# Patient Record
Sex: Male | Born: 1985
Health system: Southern US, Community
[De-identification: ages and names within clinical notes are randomized; demographics above are authoritative.]

## PROBLEM LIST (undated history)

## (undated) DIAGNOSIS — F419 Anxiety disorder, unspecified: Secondary | ICD-10-CM

## (undated) DIAGNOSIS — J45909 Unspecified asthma, uncomplicated: Secondary | ICD-10-CM

## (undated) DIAGNOSIS — T7840XA Allergy, unspecified, initial encounter: Secondary | ICD-10-CM

## (undated) DIAGNOSIS — K219 Gastro-esophageal reflux disease without esophagitis: Secondary | ICD-10-CM

## (undated) HISTORY — DX: Unspecified asthma, uncomplicated: J45.909

## (undated) HISTORY — PX: TYMPANOSTOMY TUBE PLACEMENT: SHX32

## (undated) HISTORY — DX: Gastro-esophageal reflux disease without esophagitis: K21.9

## (undated) HISTORY — DX: Allergy, unspecified, initial encounter: T78.40XA

## (undated) HISTORY — PX: APPENDECTOMY: SHX54

## (undated) HISTORY — DX: Anxiety disorder, unspecified: F41.9

---
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
## 2007-03-01 HISTORY — PX: FRACTURE SURGERY: SHX138

## 2013-10-20 ENCOUNTER — Ambulatory Visit (INDEPENDENT_AMBULATORY_CARE_PROVIDER_SITE_OTHER): Payer: BC Managed Care – PPO | Admitting: Family Medicine

## 2013-10-20 ENCOUNTER — Encounter: Payer: Self-pay | Admitting: Family Medicine

## 2013-10-20 VITALS — BP 119/71 | HR 70 | Temp 97.3°F | Ht 67.0 in | Wt 160.8 lb

## 2013-10-20 DIAGNOSIS — F32A Depression, unspecified: Secondary | ICD-10-CM

## 2013-10-20 DIAGNOSIS — R61 Generalized hyperhidrosis: Secondary | ICD-10-CM

## 2013-10-20 DIAGNOSIS — Z0000 Encounter for general adult medical examination without abnormal findings: Secondary | ICD-10-CM

## 2013-10-20 DIAGNOSIS — F3289 Other specified depressive episodes: Secondary | ICD-10-CM

## 2013-10-20 DIAGNOSIS — J45909 Unspecified asthma, uncomplicated: Secondary | ICD-10-CM

## 2013-10-20 DIAGNOSIS — L74519 Primary focal hyperhidrosis, unspecified: Secondary | ICD-10-CM

## 2013-10-20 DIAGNOSIS — K649 Unspecified hemorrhoids: Secondary | ICD-10-CM

## 2013-10-20 DIAGNOSIS — F329 Major depressive disorder, single episode, unspecified: Secondary | ICD-10-CM

## 2013-10-20 LAB — POCT CBC
Granulocyte percent: 67.9 %G (ref 37–80)
HCT, POC: 50.2 % (ref 43.5–53.7)
Hemoglobin: 16.3 g/dL (ref 14.1–18.1)
Lymph, poc: 2.1 (ref 0.6–3.4)
MCH, POC: 29.7 pg (ref 27–31.2)
MCHC: 32.5 g/dL (ref 31.8–35.4)
MCV: 91.5 fL (ref 80–97)
MPV: 8.4 fL (ref 0–99.8)
POC Granulocyte: 4.8 (ref 2–6.9)
POC LYMPH PERCENT: 29.3 %L (ref 10–50)
Platelet Count, POC: 235 10*3/uL (ref 142–424)
RBC: 5.5 M/uL (ref 4.69–6.13)
RDW, POC: 12.3 %
WBC: 7.1 10*3/uL (ref 4.6–10.2)

## 2013-10-20 MED ORDER — HYDROCORTISONE ACETATE 25 MG RE SUPP: 25.0000 mg | Freq: Two times a day (BID) | RECTAL | Status: DC

## 2013-10-20 MED ORDER — SERTRALINE HCL 50 MG PO TABS: 50.0000 mg | ORAL_TABLET | Freq: Every day | ORAL | Status: DC

## 2013-10-20 MED ORDER — FLUTICASONE-SALMETEROL 250-50 MCG/DOSE IN AEPB: 1.0000 | INHALATION_SPRAY | Freq: Two times a day (BID) | RESPIRATORY_TRACT | Status: DC

## 2013-10-20 MED ORDER — ALBUTEROL SULFATE HFA 108 (90 BASE) MCG/ACT IN AERS: 1.0000 | INHALATION_SPRAY | Freq: Four times a day (QID) | RESPIRATORY_TRACT | Status: DC | PRN

## 2013-10-20 NOTE — Patient Instructions (Signed)

## 2013-10-20 NOTE — Progress Notes (Signed)
Subjective:    Patient ID: Roy Scott, male    DOB: 09/23/1986, 27 y.o.   MRN: [id number]  HPI This 27 y.o. male presents for evaluation of lactose intolerance and anxiety. He has been having abdominal pain discomfort, IBS sx's, and GERD sx's. He has been having back pain.  He has depression problems.  He has  Been having problems with hyperhydrosis of the palms.  He has been Having difficulty with GI problems and depression though mainly.  He Has not had CPE in awhile   Review of Systems C/o GERD and anxiety. No chest pain, SOB, HA, dizziness, vision change, N/V, diarrhea, constipation, dysuria, urinary urgency or frequency, myalgias, arthralgias or rash.     Objective:   Physical Exam Vital signs noted  Well developed well nourished male.  HEENT - Head atraumatic Normocephalic                Eyes - PERRLA, Conjuctiva - clear Sclera- Clear EOMI                Ears - EAC's Wnl TM's Wnl Gross Hearing WNL                Nose - Nares patent                 Throat - oropharanx wnl Respiratory - Lungs CTA bilateral Cardiac - RRR S1 and S2 without murmur GI - Abdomen soft Nontender and bowel sounds active x 4 GU - no testicular masses no inguinal hernia, penis normal Rectal - No masses and prostates smooth and normal. Extremities - No edema. Neuro - Grossly intact.       Assessment & Plan:  Hyperhydrosis disorder - Plan: Ambulatory referral to Dermatology  Depression - Plan: sertraline (ZOLOFT) 50 MG tablet po qd.  Follow up in 3 weeks.  Routine general medical examination at a health care facility - Plan: POCT CBC, CMP14+EGFR, Thyroid Panel With TSH, Vit D  25 hydroxy (rtn osteoporosis monitoring), Vitamin B12, Lipid panel  Asthma - Plan: Fluticasone-Salmeterol (ADVAIR DISKUS) 250-50 MCG/DOSE AEPB, albuterol (PROAIR HFA) 108 (90 BASE) MCG/ACT inhaler  Hemorrhoids - Plan: hydrocortisone (ANUSOL-HC) 25 MG suppository  GERD - Nexium 40mg one po qd #10 samples given.   Recommned align probiotics otc.  Discussed follow up in 3 weeks.  William J Oxford FNP  

## 2013-10-21 LAB — LIPID PANEL
Chol/HDL Ratio: 6.3 ratio units — ABNORMAL HIGH (ref 0.0–5.0)
Cholesterol, Total: 152 mg/dL (ref 100–199)
HDL: 24 mg/dL — ABNORMAL LOW (ref >39)
LDL Calculated: 86 mg/dL (ref 0–99)
Triglycerides: 208 mg/dL — ABNORMAL HIGH (ref 0–149)
VLDL Cholesterol Cal: 42 mg/dL — ABNORMAL HIGH (ref 5–40)

## 2013-10-21 LAB — CMP14+EGFR
ALT: 12 IU/L (ref 0–44)
AST: 14 IU/L (ref 0–40)
Albumin/Globulin Ratio: 2.1 (ref 1.1–2.5)
Albumin: 4.9 g/dL (ref 3.5–5.5)
Alkaline Phosphatase: 53 IU/L (ref 39–117)
BUN/Creatinine Ratio: 10 (ref 8–19)
BUN: 11 mg/dL (ref 6–20)
CO2: 22 mmol/L (ref 18–29)
Calcium: 9.2 mg/dL (ref 8.7–10.2)
Chloride: 101 mmol/L (ref 97–108)
Creatinine, Ser: 1.05 mg/dL (ref 0.76–1.27)
GFR calc Af Amer: 112 mL/min/{1.73_m2} (ref >59)
GFR calc non Af Amer: 97 mL/min/{1.73_m2} (ref >59)
Globulin, Total: 2.3 g/dL (ref 1.5–4.5)
Glucose: 91 mg/dL (ref 65–99)
Potassium: 3.6 mmol/L (ref 3.5–5.2)
Sodium: 142 mmol/L (ref 134–144)
Total Bilirubin: 0.7 mg/dL (ref 0.0–1.2)
Total Protein: 7.2 g/dL (ref 6.0–8.5)

## 2013-10-21 LAB — THYROID PANEL WITH TSH
Free Thyroxine Index: 2.3 (ref 1.2–4.9)
T3 Uptake Ratio: 25 % (ref 24–39)
T4, Total: 9 ug/dL (ref 4.5–12.0)
TSH: 1.13 u[IU]/mL (ref 0.450–4.500)

## 2013-10-21 LAB — VITAMIN D 25 HYDROXY (VIT D DEFICIENCY, FRACTURES): Vit D, 25-Hydroxy: 31.5 ng/mL (ref 30.0–100.0)

## 2013-10-21 LAB — VITAMIN B12: Vitamin B-12: 533 pg/mL (ref 211–946)

## 2013-11-10 ENCOUNTER — Ambulatory Visit: Payer: BC Managed Care – PPO | Admitting: Family Medicine

## 2013-11-17 ENCOUNTER — Ambulatory Visit (INDEPENDENT_AMBULATORY_CARE_PROVIDER_SITE_OTHER): Payer: BC Managed Care – PPO | Admitting: Family Medicine

## 2013-11-17 ENCOUNTER — Encounter: Payer: Self-pay | Admitting: Family Medicine

## 2013-11-17 VITALS — BP 130/74 | HR 67 | Temp 98.2°F | Ht 67.0 in | Wt 167.0 lb

## 2013-11-17 DIAGNOSIS — R61 Generalized hyperhidrosis: Secondary | ICD-10-CM

## 2013-11-17 DIAGNOSIS — L74519 Primary focal hyperhidrosis, unspecified: Secondary | ICD-10-CM

## 2013-11-17 MED ORDER — ALUMINUM CHLORIDE 20 % EX SOLN: Freq: Every day | CUTANEOUS | Status: DC

## 2013-11-17 NOTE — Progress Notes (Signed)
   Subjective:    Patient ID: Roy GalleryLance C Dietzman, male    DOB: 02-Apr-1986, 28 y.o.   MRN: [id number]  HPI  This 28 y.o. male presents for evaluation of follow up on hyperhydrosis.  He was referred to  [place]atology in [place] and he states he was not satisfied.  He is still having difficulties with this.  Review of Systems C/o sweating in hands   No chest pain, SOB, HA, dizziness, vision change, N/V, diarrhea, constipation, dysuria, urinary urgency or frequency, myalgias, arthralgias or rash.  Objective:   Physical Exam  Vital signs noted  Well developed well nourished male.  HEENT - Head atraumatic Normocephalic                Eyes - PERRLA, Conjuctiva - clear Sclera- Clear EOMI                Ears - EAC's Wnl TM's Wnl Gross Hearing WNL                Throat - oropharanx wnl Respiratory - Lungs CTA bilateral Cardiac - RRR S1 and S2 without murmur GI - Abdomen soft Nontender and bowel sounds active x 4 Extremities - No edema. Skin - hyperhydrosis of the hands Neuro - Grossly intact.      Assessment & Plan:  Hyperhydrosis disorder - Plan: aluminum chloride (HYPERCARE) 20 % external solution If not working then consider referral to [facility] in [place].  Deatra CanterWilliam J Oxford FNP

## 2015-10-13 ENCOUNTER — Encounter: Payer: Self-pay | Admitting: Family Medicine

## 2015-10-13 ENCOUNTER — Ambulatory Visit (INDEPENDENT_AMBULATORY_CARE_PROVIDER_SITE_OTHER): Payer: BLUE CROSS/BLUE SHIELD | Admitting: Family Medicine

## 2015-10-13 ENCOUNTER — Ambulatory Visit: Payer: Self-pay | Admitting: Family

## 2015-10-13 VITALS — BP 134/83 | HR 78 | Temp 97.3°F | Ht 67.0 in | Wt 177.4 lb

## 2015-10-13 DIAGNOSIS — J45909 Unspecified asthma, uncomplicated: Secondary | ICD-10-CM

## 2015-10-13 MED ORDER — ALBUTEROL SULFATE HFA 108 (90 BASE) MCG/ACT IN AERS: 1.0000 | INHALATION_SPRAY | Freq: Four times a day (QID) | RESPIRATORY_TRACT | Status: DC | PRN

## 2015-10-13 MED ORDER — PREDNISONE 20 MG PO TABS: ORAL_TABLET | ORAL | Status: DC

## 2015-10-13 MED ORDER — FLUTICASONE-SALMETEROL 250-50 MCG/DOSE IN AEPB: 1.0000 | INHALATION_SPRAY | Freq: Two times a day (BID) | RESPIRATORY_TRACT | Status: DC

## 2015-10-13 NOTE — Progress Notes (Signed)
BP 134/83 mmHg  Pulse 78  Temp(Src) 97.3 F (36.3 C) (Oral)  Ht  (1.702 m)  Wt 177 lb 6.4 oz (80.468 kg)  BMI 27.78 kg/m2   Subjective:    Patient ID: Roy Scott, male    DOB: [birth year], 30 y.o.   MRN: [id number]  HPI: Roy Scott is a 30 y.o. male presenting on 10/13/2015 for Sinusitis; Cough; and skin tag   HPI Sinus congestion and cough and a small amount of wheeze Patient has been having sinus congestion and coughing and a small amount of wheezing that has been persistent over the past 3 weeks. He denies any fevers or chills. The cough is been mostly dry and is keeping him up at night. He has been diagnosed with asthma in the past and feels like this could be starting to bring it on. He has been out of all of his inhaler so he has not had anything that he could use for them. The sore throat is what's bothering the most and it is worse at night along with the coughing. Usually has to use an inhaler in springtime and fall time mostly. In the spring and fall he has to use it every other day. In the winter and summer he does a lot better where he only has to use it a few times a month. Except for when he gets sick like he is currently usually doesn't have a lot of nighttime symptoms.  Relevant past medical, surgical, family and social history reviewed and updated as indicated. Interim medical history since our last visit reviewed. Allergies and medications reviewed and updated.  Review of Systems  Constitutional: Negative for fever and chills.  HENT: Positive for congestion, postnasal drip, rhinorrhea, sinus pressure, sneezing and sore throat. Negative for ear discharge, ear pain and voice change.   Eyes: Negative for pain, discharge, redness and visual disturbance.  Respiratory: Positive for cough and wheezing. Negative for shortness of breath.   Cardiovascular: Negative for chest pain and leg swelling.  Gastrointestinal: Negative for abdominal pain, diarrhea and  constipation.  Genitourinary: Negative for difficulty urinating.  Musculoskeletal: Negative for back pain and gait problem.  Skin: Negative for rash.  Neurological: Negative for syncope, light-headedness and headaches.  All other systems reviewed and are negative.   Per HPI unless specifically indicated above     Medication List       This list is accurate as of: 10/13/15  2:41 PM.  Always use your most recent med list.               albuterol 108 (90 Base) MCG/ACT inhaler  Commonly known as:  PROAIR HFA  Inhale 1-2 puffs into the lungs every 6 (six) hours as needed for wheezing or shortness of breath.     CHLORASEPTIC 1.4 % Liqd  Generic drug:  phenol  Use as directed 1 spray in the mouth or throat as needed for throat irritation / pain.     Fluticasone-Salmeterol 250-50 MCG/DOSE Aepb  Commonly known as:  ADVAIR DISKUS  Inhale 1 puff into the lungs 2 (two) times daily at 10 AM and 5 PM.     ibuprofen 200 MG tablet  Commonly known as:  ADVIL,MOTRIN  Take 400 mg by mouth every 6 (six) hours as needed.     predniSONE 20 MG tablet  Commonly known as:  DELTASONE  2 po at same time daily for 5 days  Objective:    BP 134/83 mmHg  Pulse 78  Temp(Src) 97.3 F (36.3 C) (Oral)  Ht  (1.702 m)  Wt 177 lb 6.4 oz (80.468 kg)  BMI 27.78 kg/m2  Wt Readings from Last 3 Encounters:  10/13/15 177 lb 6.4 oz (80.468 kg)  11/17/13 167 lb (75.751 kg)  10/20/13 160 lb 12.8 oz (72.938 kg)    Physical Exam  Constitutional: He is oriented to person, place, and time. He appears well-developed and well-nourished. No distress.  HENT:  Right Ear: Tympanic membrane, external ear and ear canal normal.  Left Ear: Tympanic membrane, external ear and ear canal normal.  Nose: Mucosal edema and rhinorrhea present. No sinus tenderness. No epistaxis. Right sinus exhibits no maxillary sinus tenderness and no frontal sinus tenderness. Left sinus exhibits no maxillary sinus  tenderness and no frontal sinus tenderness.  Mouth/Throat: Uvula is midline and mucous membranes are normal. Posterior oropharyngeal edema and posterior oropharyngeal erythema present. No oropharyngeal exudate or tonsillar abscesses.  Eyes: Conjunctivae and EOM are normal. Pupils are equal, round, and reactive to light. Right eye exhibits no discharge. No scleral icterus.  Neck: Neck supple. No thyromegaly present.  Cardiovascular: Normal rate, regular rhythm, normal heart sounds and intact distal pulses.   No murmur heard. Pulmonary/Chest: Effort normal and breath sounds normal. No respiratory distress. He has no wheezes. He has no rales.  Musculoskeletal: Normal range of motion. He exhibits no edema.  Lymphadenopathy:    He has no cervical adenopathy.  Neurological: He is alert and oriented to person, place, and time. Coordination normal.  Skin: Skin is warm and dry. No rash noted. He is not diaphoretic.  Psychiatric: He has a normal mood and affect. His behavior is normal.  Nursing note and vitals reviewed.       Assessment & Plan:   Problem List Items Addressed This Visit    None    Visit Diagnoses    Asthma, unspecified asthma severity, uncomplicated    -  Primary    mild exacerbation, steroids and antihistamine and antibiotic    Relevant Medications    Fluticasone-Salmeterol (ADVAIR DISKUS) 250-50 MCG/DOSE AEPB    albuterol (PROAIR HFA) 108 (90 Base) MCG/ACT inhaler    predniSONE (DELTASONE) 20 MG tablet        Follow up plan: Return in about 6 months (around 04/11/2016), or if symptoms worsen or fail to improve, for Asthma recheck.  Counseling provided for all of the vaccine components No orders of the defined types were placed in this encounter.    Arville Care, MD [facility] 10/13/2015, 2:41 PM

## 2015-10-15 ENCOUNTER — Ambulatory Visit: Payer: BLUE CROSS/BLUE SHIELD | Admitting: Family Medicine

## 2015-10-18 ENCOUNTER — Encounter: Payer: Self-pay | Admitting: Family Medicine

## 2015-11-30 ENCOUNTER — Encounter: Payer: Self-pay | Admitting: Family Medicine

## 2015-11-30 ENCOUNTER — Encounter (INDEPENDENT_AMBULATORY_CARE_PROVIDER_SITE_OTHER): Payer: Self-pay

## 2015-11-30 ENCOUNTER — Ambulatory Visit (INDEPENDENT_AMBULATORY_CARE_PROVIDER_SITE_OTHER): Payer: BLUE CROSS/BLUE SHIELD | Admitting: Family Medicine

## 2015-11-30 VITALS — BP 125/87 | HR 65 | Temp 97.5°F | Ht 67.0 in | Wt 176.8 lb

## 2015-11-30 DIAGNOSIS — J029 Acute pharyngitis, unspecified: Secondary | ICD-10-CM

## 2015-11-30 LAB — CULTURE, GROUP A STREP

## 2015-11-30 LAB — RAPID STREP SCREEN (MED CTR MEBANE ONLY): STREP GP A AG, IA W/REFLEX: NEGATIVE

## 2015-11-30 MED ORDER — FLUCONAZOLE 150 MG PO TABS: 150.0000 mg | ORAL_TABLET | Freq: Once | ORAL | Status: DC

## 2015-11-30 NOTE — Progress Notes (Signed)
BP 125/87 mmHg  Pulse 65  Temp(Src) 97.5 F (36.4 C) (Oral)  Ht 5\' 7"  (1.702 m)  Wt 176 lb 12.8 oz (80.196 kg)  BMI 27.68 kg/m2   Subjective:    Patient ID: Roy Scott, male    DOB: [birth year], 30 y.o.   MRN: [id number]  HPI: Roy Scott is a 30 y.o. male presenting on 11/30/2015 for No chief complaint on file.   HPI Sore throat Patient has had persistent sore throat for the past 2 months. He never really got better from last time he saw me. I did improve slightly with the treatment that we started but then it is persisted. Over the past week it is certainly worsen and he feels like his throat is certainly get tight and close up on him. He does have asthma and is on current treatment for asthma with both Advair and pro-air. He does admit that he does not swish and swallow after the medication. He does also admit to having some acid reflux and heartburn. He denies any blood in his stools or vomiting with blood. He denies any abdominal pain outside of the indigestion. He is just concerned because it is not improving.  Relevant past medical, surgical, family and social history reviewed and updated as indicated. Interim medical history since our last visit reviewed. Allergies and medications reviewed and updated.  Review of Systems  Constitutional: Negative for fever and chills.  HENT: Positive for sore throat. Negative for congestion, ear discharge, ear pain, mouth sores, sinus pressure and sneezing.   Eyes: Negative for discharge and visual disturbance.  Respiratory: Negative for cough, chest tightness, shortness of breath and wheezing.   Cardiovascular: Negative for chest pain and leg swelling.  Gastrointestinal: Negative for abdominal pain, diarrhea and constipation.  Genitourinary: Negative for difficulty urinating.  Musculoskeletal: Negative for back pain and gait problem.  Skin: Negative for rash.  Neurological: Negative for syncope, light-headedness and headaches.  All  other systems reviewed and are negative.   Per HPI unless specifically indicated above     Medication List       This list is accurate as of: 11/30/15  2:34 PM.  Always use your most recent med list.               albuterol 108 (90 Base) MCG/ACT inhaler  Commonly known as:  PROAIR HFA  Inhale 1-2 puffs into the lungs every 6 (six) hours as needed for wheezing or shortness of breath.     fluconazole 150 MG tablet  Commonly known as:  DIFLUCAN  Take 1 tablet (150 mg total) by mouth once.     Fluticasone-Salmeterol 250-50 MCG/DOSE Aepb  Commonly known as:  ADVAIR DISKUS  Inhale 1 puff into the lungs 2 (two) times daily at 10 AM and 5 PM.     ibuprofen 200 MG tablet  Commonly known as:  ADVIL,MOTRIN  Take 400 mg by mouth every 6 (six) hours as needed.           Objective:    BP 125/87 mmHg  Pulse 65  Temp(Src) 97.5 F (36.4 C) (Oral)  Ht 5\' 7"  (1.702 m)  Wt 176 lb 12.8 oz (80.196 kg)  BMI 27.68 kg/m2  Wt Readings from Last 3 Encounters:  11/30/15 176 lb 12.8 oz (80.196 kg)  10/13/15 177 lb 6.4 oz (80.468 kg)  11/17/13 167 lb (75.751 kg)    Physical Exam  Constitutional: He is oriented to person, place, and time. He appears  well-developed and well-nourished. No distress.  HENT:  Head: Normocephalic.  Right Ear: External ear normal.  Left Ear: External ear normal.  Nose: Nose normal.  Mouth/Throat: Oropharynx is clear and moist. No oropharyngeal exudate (no visible signs of abnormalities in his oropharynx. No visible signs of thrush but just suspicious for with the inhalers and the symptoms.).  Eyes: Conjunctivae and EOM are normal. Pupils are equal, round, and reactive to light. Right eye exhibits no discharge. No scleral icterus.  Neck: Neck supple. No thyromegaly present.  Cardiovascular: Normal rate, regular rhythm, normal heart sounds and intact distal pulses.   No murmur heard. Pulmonary/Chest: Effort normal and breath sounds normal. No respiratory  distress. He has no wheezes.  Musculoskeletal: Normal range of motion. He exhibits no edema.  Lymphadenopathy:    He has no cervical adenopathy.  Neurological: He is alert and oriented to person, place, and time. Coordination normal.  Skin: Skin is warm and dry. No rash noted. He is not diaphoretic.  Psychiatric: He has a normal mood and affect. His behavior is normal.  Vitals reviewed.      Assessment & Plan:   Problem List Items Addressed This Visit    None    Visit Diagnoses    Sore throat    -  Primary    Persistent pharyngitis for 2 months, will try dose of Diflucan because he is on inhaled steroid, if not improved ENT    Relevant Medications    fluconazole (DIFLUCAN) 150 MG tablet    Other Relevant Orders    Rapid strep screen (not at [facility])    Ambulatory referral to ENT        Follow up plan: Return in about 3 months (around 03/01/2016), or if symptoms worsen or fail to improve, for Recheck asthma.  Counseling provided for all of the vaccine components Orders Placed This Encounter  Procedures  . Rapid strep screen (not at [facility])  . Ambulatory referral to ENT    Arville Care, MD [facility] 11/30/2015, 2:34 PM

## 2015-12-02 ENCOUNTER — Telehealth: Payer: Self-pay | Admitting: Family Medicine

## 2015-12-02 NOTE — Telephone Encounter (Signed)
No answer no vm

## 2015-12-02 NOTE — Telephone Encounter (Signed)
Informed pt that Rx for Diflucan went to [facility], he will check back with them

## 2016-06-15 ENCOUNTER — Encounter: Payer: Self-pay | Admitting: Family Medicine

## 2016-06-15 ENCOUNTER — Ambulatory Visit (INDEPENDENT_AMBULATORY_CARE_PROVIDER_SITE_OTHER): Payer: Self-pay | Admitting: Family Medicine

## 2016-06-15 VITALS — BP 116/80 | HR 69 | Temp 97.9°F | Ht 67.0 in | Wt 169.4 lb

## 2016-06-15 DIAGNOSIS — M5442 Lumbago with sciatica, left side: Secondary | ICD-10-CM

## 2016-06-15 DIAGNOSIS — F411 Generalized anxiety disorder: Secondary | ICD-10-CM

## 2016-06-15 MED ORDER — CYCLOBENZAPRINE HCL 10 MG PO TABS: 10.0000 mg | ORAL_TABLET | Freq: Three times a day (TID) | ORAL | 1 refills | Status: DC | PRN

## 2016-06-15 MED ORDER — HYDROXYZINE HCL 50 MG PO TABS: 50.0000 mg | ORAL_TABLET | Freq: Three times a day (TID) | ORAL | 2 refills | Status: DC | PRN

## 2016-06-15 NOTE — Progress Notes (Signed)
BP 116/80   Pulse 69   Temp 97.9 F (36.6 C) (Oral)   Ht 5\' 7"  (1.702 m)   Wt 169 lb 6 oz (76.8 kg)   BMI 26.53 kg/m    Subjective:    Patient ID: Roy Scott, male    DOB: [birth year], 30 y.o.   MRN: [id number]  HPI: Roy Scott is a 30 y.o. male presenting on 06/15/2016 for Back Pain (knot on lower left side of back, low back pain)   HPI Low back pain with intermittent sciatica Patient is having low back pain with intermittent sciatica going down his left leg. It is left lower back that hurts and he feels like there is a knot there and the spot where it hurts. He has been having this for quite some time but over the last month it has increased to the point where he is coming in to talk about something to help with this. He denies any numbness or weakness going down into his leg. He does sciatica is only an occasional shooting pain that goes down the back of his left leg to his knee but no lower. He denies any loss of bowel or loss of bladder.  Anxiety Patient has been having intermittent anxiety where sometimes it'll build up that he feels that tightness in his throat and that's why the ENT recommended treatment for his anxiety to help control this. He denies any suicidal ideations or thoughts of hurting himself. He does admit that he has trouble sleeping at night sometimes. He doesn't feel like it every day.  Relevant past medical, surgical, family and social history reviewed and updated as indicated. Interim medical history since our last visit reviewed. Allergies and medications reviewed and updated.  Review of Systems  Constitutional: Negative for chills and fever.  Eyes: Negative for discharge.  Respiratory: Negative for shortness of breath and wheezing.   Cardiovascular: Negative for chest pain and leg swelling.  Musculoskeletal: Positive for back pain and myalgias. Negative for gait problem.  Skin: Negative for rash.  Psychiatric/Behavioral: Positive for sleep  disturbance. Negative for decreased concentration, dysphoric mood, self-injury and suicidal ideas. The patient is nervous/anxious.   All other systems reviewed and are negative.   Per HPI unless specifically indicated above     Medication List       Accurate as of 06/15/16  1:27 PM. Always use your most recent med list.          albuterol 108 (90 Base) MCG/ACT inhaler Commonly known as:  PROAIR HFA Inhale 1-2 puffs into the lungs every 6 (six) hours as needed for wheezing or shortness of breath.   cyclobenzaprine 10 MG tablet Commonly known as:  FLEXERIL Take 1 tablet (10 mg total) by mouth 3 (three) times daily as needed for muscle spasms.   Fluticasone-Salmeterol 250-50 MCG/DOSE Aepb Commonly known as:  ADVAIR DISKUS Inhale 1 puff into the lungs 2 (two) times daily at 10 AM and 5 PM.   hydrOXYzine 50 MG tablet Commonly known as:  ATARAX/VISTARIL Take 1 tablet (50 mg total) by mouth 3 (three) times daily as needed.   ibuprofen 200 MG tablet Commonly known as:  ADVIL,MOTRIN Take 400 mg by mouth every 6 (six) hours as needed.          Objective:    BP 116/80   Pulse 69   Temp 97.9 F (36.6 C) (Oral)   Ht 5\' 7"  (1.702 m)   Wt 169 lb 6 oz (76.8  kg)   BMI 26.53 kg/m   Wt Readings from Last 3 Encounters:  06/15/16 169 lb 6 oz (76.8 kg)  11/30/15 176 lb 12.8 oz (80.2 kg)  10/13/15 177 lb 6.4 oz (80.5 kg)    Physical Exam  Constitutional: He is oriented to person, place, and time. He appears well-developed and well-nourished. No distress.  Eyes: Conjunctivae are normal. Right eye exhibits no discharge. No scleral icterus.  Cardiovascular: Normal rate, regular rhythm, normal heart sounds and intact distal pulses.   No murmur heard. Pulmonary/Chest: Effort normal and breath sounds normal. No respiratory distress. He has no wheezes.  Musculoskeletal: Normal range of motion. He exhibits no edema.       Lumbar back: He exhibits tenderness (Left lower muscular  tenderness, negative straight leg raise bilaterally). He exhibits normal range of motion, no bony tenderness, no swelling and normal pulse.  Neurological: He is alert and oriented to person, place, and time. Coordination normal.  Skin: Skin is warm and dry. No rash noted. He is not diaphoretic.  Psychiatric: He has a normal mood and affect. His behavior is normal.  Nursing note and vitals reviewed.     Assessment & Plan:   Problem List Items Addressed This Visit    None    Visit Diagnoses    Anxiety state    -  Primary   Relevant Medications   hydrOXYzine (ATARAX/VISTARIL) 50 MG tablet   Left-sided low back pain with left-sided sciatica       Use anti-inflammatories and Flexeril and stretches   Relevant Medications   cyclobenzaprine (FLEXERIL) 10 MG tablet       Follow up plan: Return if symptoms worsen or fail to improve.  Counseling provided for all of the vaccine components No orders of the defined types were placed in this encounter.   Arville Care, MD [facility] 06/15/2016, 1:27 PM

## 2016-10-21 ENCOUNTER — Encounter: Payer: Self-pay | Admitting: Pediatrics

## 2016-10-21 ENCOUNTER — Ambulatory Visit (INDEPENDENT_AMBULATORY_CARE_PROVIDER_SITE_OTHER): Payer: BLUE CROSS/BLUE SHIELD | Admitting: Pediatrics

## 2016-10-21 VITALS — BP 122/79 | HR 80 | Temp 97.1°F | Ht 67.0 in | Wt 175.0 lb

## 2016-10-21 DIAGNOSIS — G8929 Other chronic pain: Secondary | ICD-10-CM

## 2016-10-21 DIAGNOSIS — R350 Frequency of micturition: Secondary | ICD-10-CM

## 2016-10-21 DIAGNOSIS — M5442 Lumbago with sciatica, left side: Secondary | ICD-10-CM

## 2016-10-21 MED ORDER — CYCLOBENZAPRINE HCL 10 MG PO TABS: 10.0000 mg | ORAL_TABLET | Freq: Three times a day (TID) | ORAL | 1 refills | Status: DC | PRN

## 2016-10-21 NOTE — Patient Instructions (Addendum)
Back Exercises Introduction If you have pain in your back, do these exercises 2-3 times each day or as told by your doctor. When the pain goes away, do the exercises once each day, but repeat the steps more times for each exercise (do more repetitions). If you do not have pain in your back, do these exercises once each day or as told by your doctor. Exercises Single Knee to Chest  Do these steps 3-5 times in a row for each leg: 1. Lie on your back on a firm bed or the floor with your legs stretched out. 2. Bring one knee to your chest. 3. Hold your knee to your chest by grabbing your knee or thigh. 4. Pull on your knee until you feel a gentle stretch in your lower back. 5. Keep doing the stretch for 10-30 seconds. 6. Slowly let go of your leg and straighten it. Pelvic Tilt  Do these steps 5-10 times in a row: 1. Lie on your back on a firm bed or the floor with your legs stretched out. 2. Bend your knees so they point up to the ceiling. Your feet should be flat on the floor. 3. Tighten your lower belly (abdomen) muscles to press your lower back against the floor. This will make your tailbone point up to the ceiling instead of pointing down to your feet or the floor. 4. Stay in this position for 5-10 seconds while you gently tighten your muscles and breathe evenly. Cat-Cow  Do these steps until your lower back bends more easily: 1. Get on your hands and knees on a firm surface. Keep your hands under your shoulders, and keep your knees under your hips. You may put padding under your knees. 2. Let your head hang down, and make your tailbone point down to the floor so your lower back is round like the back of a cat. 3. Stay in this position for 5 seconds. 4. Slowly lift your head and make your tailbone point up to the ceiling so your back hangs low (sags) like the back of a cow. 5. Stay in this position for 5 seconds. Press-Ups  Do these steps 5-10 times in a row: 1. Lie on your belly  (face-down) on the floor. 2. Place your hands near your head, about shoulder-width apart. 3. While you keep your back relaxed and keep your hips on the floor, slowly straighten your arms to raise the top half of your body and lift your shoulders. Do not use your back muscles. To make yourself more comfortable, you may change where you place your hands. 4. Stay in this position for 5 seconds. 5. Slowly return to lying flat on the floor. Bridges  Do these steps 10 times in a row: 1. Lie on your back on a firm surface. 2. Bend your knees so they point up to the ceiling. Your feet should be flat on the floor. 3. Tighten your butt muscles and lift your butt off of the floor until your waist is almost as high as your knees. If you do not feel the muscles working in your butt and the back of your thighs, slide your feet 1-2 inches farther away from your butt. 4. Stay in this position for 3-5 seconds. 5. Slowly lower your butt to the floor, and let your butt muscles relax. If this exercise is too easy, try doing it with your arms crossed over your chest. Belly Crunches  Do these steps 5-10 times in a row: 1. Lie   on your back on a firm bed or the floor with your legs stretched out. 2. Bend your knees so they point up to the ceiling. Your feet should be flat on the floor. 3. Cross your arms over your chest. 4. Tip your chin a little bit toward your chest but do not bend your neck. 5. Tighten your belly muscles and slowly raise your chest just enough to lift your shoulder blades a tiny bit off of the floor. 6. Slowly lower your chest and your head to the floor. Back Lifts  Do these steps 5-10 times in a row: 1. Lie on your belly (face-down) with your arms at your sides, and rest your forehead on the floor. 2. Tighten the muscles in your legs and your butt. 3. Slowly lift your chest off of the floor while you keep your hips on the floor. Keep the back of your head in line with the curve in your back.  Look at the floor while you do this. 4. Stay in this position for 3-5 seconds. 5. Slowly lower your chest and your face to the floor. Contact a doctor if:  Your back pain gets a lot worse when you do an exercise.  Your back pain does not lessen 2 hours after you exercise. If you have any of these problems, stop doing the exercises. Do not do them again unless your doctor says it is okay. Get help right away if:  You have sudden, very bad back pain. If this happens, stop doing the exercises. Do not do them again unless your doctor says it is okay. This information is not intended to replace advice given to you by your health care provider. Make sure you discuss any questions you have with your health care provider. Document Released: 10/14/2010 Document Revised: 02/17/2016 Document Reviewed: 11/05/2014  2017 [employer]vier  Shoulder Range of Motion Exercises Shoulder range of motion (ROM) exercises are designed to keep the shoulder moving freely. They are often recommended for people who have shoulder pain. Phase 1 exercises When you are able, do this exercise 5-6 days per week, or as told by your health care provider. Work toward doing 2 sets of 10 swings. Pendulum Exercise  How To Do This Exercise Lying Down 7. Lie face-down on a bed with your abdomen close to the side of the bed. 8. Let your arm hang over the side of the bed. 9. Relax your shoulder, arm, and hand. 10. Slowly and gently swing your arm forward and back. Do not use your neck muscles to swing your arm. They should be relaxed. If you are struggling to swing your arm, have someone gently swing it for you. When you do this exercise for the first time, swing your arm at a 15 degree angle for 15 seconds, or swing your arm 10 times. As pain lessens over time, increase the angle of the swing to 30-45 degrees. 11. Repeat steps 1-4 with the other arm. How To Do This Exercise While Standing 5. Stand next to a sturdy chair or table and  hold on to it with your hand. 1. Bend forward at the waist. 2. Bend your knees slightly. 3. Relax your other arm and let it hang limp. 4. Relax the shoulder blade of the arm that is hanging and let it drop. 5. While keeping your shoulder relaxed, use body motion to swing your arm in small circles. The first time you do this exercise, swing your arm for about 30 seconds or 10  times. When you do it next time, swing your arm for a little longer. 6. Stand up tall and relax. 7. Repeat steps 1-7, this time changing the direction of the circles. 6. Repeat steps 1-8 with the other arm. Phase 2 exercises Do these exercises 3-4 times per day on 5-6 days per week or as told by your health care provider. Work toward holding the stretch for 20 seconds. Stretching Exercise 1 6. Lift your arm straight out in front of you. 7. Bend your arm 90 degrees at the elbow (right angle) so your forearm goes across your body and looks like the letter "L." 8. Use your other arm to gently pull the elbow forward and across your body. 9. Repeat steps 1-3 with the other arm. Stretching Exercise 2  You will need a towel or rope for this exercise. 6. Bend one arm behind your back with the palm facing outward. 7. Hold a towel with your other hand. 8. Reach the arm that holds the towel above your head, and bend that arm at the elbow. Your wrist should be behind your neck. 9. Use your free hand to grab the free end of the towel. 10. With the higher hand, gently pull the towel up behind you. 11. With the lower hand, pull the towel down behind you. 12. Repeat steps 1-6 with the other arm. Phase 3 exercises Do each of these exercises at four different times of day (sessions) every day or as told by your health care provider. To begin with, repeat each exercise 5 times (repetitions). Work toward doing 3 sets of 12 repetitions or as told by your health care provider. Strengthening Exercise 1  You will need a light weight for this  activity. As you grow stronger, you may use a heavier weight. 6. Standing with a weight in your hand, lift your arm straight out to the side until it is at the same height as your shoulder. 7. Bend your arm at 90 degrees so that your fingers are pointing to the ceiling. 8. Slowly raise your hand until your arm is straight up in the air. 9. Repeat steps 1-3 with the other arm. Strengthening Exercise 2  You will need a light weight for this activity. As you grow stronger, you may use a heavier weight. 7. Standing with a weight in your hand, gradually move your straight arm in an arc, starting at your side, then out in front of you, then straight up over your head. 8. Gradually move your other arm in an arc, starting at your side, then out in front of you, then straight up over your head. 9. Repeat steps 1-2 with the other arm. Strengthening Exercise 3  You will need an elastic band for this activity. As you grow stronger, gradually increase the size of the bands or increase the number of bands that you use at one time. 6. While standing, hold an elastic band in one hand and raise that arm up in the air. 7. With your other hand, pull down the band until that hand is by your side. 8. Repeat steps 1-2 with the other arm. This information is not intended to replace advice given to you by your health care provider. Make sure you discuss any questions you have with your health care provider. Document Released: 06/10/2003 Document Revised: 05/07/2016 Document Reviewed: 09/07/2014 Elsevier Interactive Patient Education  2017 [employer].
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
## 2016-10-22 LAB — URINE CULTURE: ORGANISM ID, BACTERIA: NO GROWTH

## 2016-10-23 ENCOUNTER — Other Ambulatory Visit: Payer: Self-pay | Admitting: Family Medicine

## 2016-10-23 DIAGNOSIS — J45909 Unspecified asthma, uncomplicated: Secondary | ICD-10-CM

## 2016-10-23 LAB — URINALYSIS
Bilirubin, UA: NEGATIVE
GLUCOSE, UA: NEGATIVE
Leukocytes, UA: NEGATIVE
Nitrite, UA: NEGATIVE
Protein, UA: NEGATIVE
SPEC GRAV UA: 1.02 (ref 1.005–1.030)
UUROB: 0.2 mg/dL (ref 0.2–1.0)
pH, UA: 5.5 (ref 5.0–7.5)

## 2017-04-07 DIAGNOSIS — R209 Unspecified disturbances of skin sensation: Secondary | ICD-10-CM | POA: Diagnosis not present

## 2017-04-12 ENCOUNTER — Encounter: Payer: Self-pay | Admitting: Family Medicine

## 2017-04-12 ENCOUNTER — Ambulatory Visit (INDEPENDENT_AMBULATORY_CARE_PROVIDER_SITE_OTHER): Payer: BLUE CROSS/BLUE SHIELD | Admitting: Family Medicine

## 2017-04-12 VITALS — BP 128/77 | HR 93 | Temp 97.8°F | Ht 67.0 in | Wt 170.0 lb

## 2017-04-12 DIAGNOSIS — K219 Gastro-esophageal reflux disease without esophagitis: Secondary | ICD-10-CM

## 2017-04-12 DIAGNOSIS — S161XXA Strain of muscle, fascia and tendon at neck level, initial encounter: Secondary | ICD-10-CM

## 2017-04-12 DIAGNOSIS — H04123 Dry eye syndrome of bilateral lacrimal glands: Secondary | ICD-10-CM | POA: Diagnosis not present

## 2017-04-12 DIAGNOSIS — H40033 Anatomical narrow angle, bilateral: Secondary | ICD-10-CM | POA: Diagnosis not present

## 2017-04-12 MED ORDER — CYCLOBENZAPRINE HCL 10 MG PO TABS: 10.0000 mg | ORAL_TABLET | Freq: Three times a day (TID) | ORAL | 1 refills | Status: DC | PRN

## 2017-04-12 MED ORDER — FLUTICASONE-SALMETEROL 250-50 MCG/DOSE IN AEPB: 1.0000 | INHALATION_SPRAY | Freq: Two times a day (BID) | RESPIRATORY_TRACT | 3 refills | Status: DC

## 2017-04-12 MED ORDER — ALBUTEROL SULFATE HFA 108 (90 BASE) MCG/ACT IN AERS: 1.0000 | INHALATION_SPRAY | RESPIRATORY_TRACT | 5 refills | Status: DC | PRN

## 2017-04-12 MED ORDER — RANITIDINE HCL 150 MG PO TABS: 150.0000 mg | ORAL_TABLET | Freq: Two times a day (BID) | ORAL | 2 refills | Status: DC
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
## 2017-04-25 DIAGNOSIS — M9901 Segmental and somatic dysfunction of cervical region: Secondary | ICD-10-CM | POA: Diagnosis not present

## 2017-04-25 DIAGNOSIS — M9904 Segmental and somatic dysfunction of sacral region: Secondary | ICD-10-CM | POA: Diagnosis not present

## 2017-04-25 DIAGNOSIS — M9902 Segmental and somatic dysfunction of thoracic region: Secondary | ICD-10-CM | POA: Diagnosis not present

## 2017-04-25 DIAGNOSIS — M9903 Segmental and somatic dysfunction of lumbar region: Secondary | ICD-10-CM | POA: Diagnosis not present

## 2017-04-30 DIAGNOSIS — M9901 Segmental and somatic dysfunction of cervical region: Secondary | ICD-10-CM | POA: Diagnosis not present

## 2017-04-30 DIAGNOSIS — M9903 Segmental and somatic dysfunction of lumbar region: Secondary | ICD-10-CM | POA: Diagnosis not present

## 2017-04-30 DIAGNOSIS — M9902 Segmental and somatic dysfunction of thoracic region: Secondary | ICD-10-CM | POA: Diagnosis not present

## 2017-04-30 DIAGNOSIS — M9904 Segmental and somatic dysfunction of sacral region: Secondary | ICD-10-CM | POA: Diagnosis not present

## 2017-05-03 DIAGNOSIS — M9903 Segmental and somatic dysfunction of lumbar region: Secondary | ICD-10-CM | POA: Diagnosis not present

## 2017-05-03 DIAGNOSIS — M9901 Segmental and somatic dysfunction of cervical region: Secondary | ICD-10-CM | POA: Diagnosis not present

## 2017-05-03 DIAGNOSIS — M9902 Segmental and somatic dysfunction of thoracic region: Secondary | ICD-10-CM | POA: Diagnosis not present

## 2017-05-03 DIAGNOSIS — M9904 Segmental and somatic dysfunction of sacral region: Secondary | ICD-10-CM | POA: Diagnosis not present

## 2017-05-04 ENCOUNTER — Telehealth: Payer: Self-pay | Admitting: Family Medicine

## 2017-05-04 NOTE — Telephone Encounter (Signed)
Patient given appointment for Monday with [facility].

## 2017-05-07 ENCOUNTER — Ambulatory Visit (INDEPENDENT_AMBULATORY_CARE_PROVIDER_SITE_OTHER): Payer: BLUE CROSS/BLUE SHIELD | Admitting: Family Medicine

## 2017-05-07 ENCOUNTER — Encounter: Payer: Self-pay | Admitting: Family Medicine

## 2017-05-07 VITALS — BP 129/83 | HR 84 | Temp 97.0°F | Ht 67.0 in | Wt 174.0 lb

## 2017-05-07 DIAGNOSIS — M545 Low back pain, unspecified: Secondary | ICD-10-CM

## 2017-05-07 DIAGNOSIS — G8929 Other chronic pain: Secondary | ICD-10-CM | POA: Diagnosis not present

## 2017-05-07 DIAGNOSIS — M546 Pain in thoracic spine: Secondary | ICD-10-CM | POA: Diagnosis not present

## 2017-05-07 MED ORDER — PREDNISONE 20 MG PO TABS: ORAL_TABLET | ORAL | 0 refills | Status: DC

## 2017-05-07 NOTE — Progress Notes (Signed)
BP 129/83   Pulse 84   Temp (!) 97 F (36.1 C) (Oral)   Ht 5\' 7"  (1.702 m)   Wt 174 lb (78.9 kg)   BMI 27.25 kg/m    Subjective:    Patient ID: Roy Scott, male    DOB: June 23, 1986, 31 y.o.   MRN: [id number]  HPI: Roy Scott is a 31 y.o. male presenting on 05/07/2017 for Back Pain (lumbar, would like to schedule an MRI)   HPI Low Back Pain Patient complains of low back pain/mid back pain that has been going on for at least a few weeks this time and possibly longer than that he's been dealing with it off and on chronically. Today in the office he says that 3 out of 10 and does not radiate anywhere. When it's at its most severe it's an 8 out of 10. He did go see a chiropractor and has been using a muscle relaxer and ibuprofen very consistently. He says that they helped a little but now he feels like it's actually starting to worsen again. He denies any numbness or weakness or loss of bowel or bladder.  Relevant past medical, surgical, family and social history reviewed and updated as indicated. Interim medical history since our last visit reviewed. Allergies and medications reviewed and updated.  Review of Systems  Constitutional: Negative for chills and fever.  Respiratory: Negative for shortness of breath and wheezing.   Cardiovascular: Negative for chest pain and leg swelling.  Musculoskeletal: Positive for back pain and myalgias. Negative for gait problem.  Skin: Negative for rash.  Neurological: Negative for weakness, light-headedness, numbness and headaches.  All other systems reviewed and are negative.   Per HPI unless specifically indicated above     Objective:    BP 129/83   Pulse 84   Temp (!) 97 F (36.1 C) (Oral)   Ht 5\' 7"  (1.702 m)   Wt 174 lb (78.9 kg)   BMI 27.25 kg/m   Wt Readings from Last 3 Encounters:  05/07/17 174 lb (78.9 kg)  04/12/17 170 lb (77.1 kg)  10/21/16 175 lb (79.4 kg)    Physical Exam  Constitutional: He is oriented to person,  place, and time. He appears well-developed and well-nourished. No distress.  Eyes: Conjunctivae are normal. No scleral icterus.  Cardiovascular: Normal rate, regular rhythm, normal heart sounds and intact distal pulses.   No murmur heard. Pulmonary/Chest: Effort normal and breath sounds normal. No respiratory distress. He has no wheezes. He has no rales.  Abdominal: Soft. Bowel sounds are normal. He exhibits no distension. There is no tenderness. There is no rebound and no guarding.  Musculoskeletal: Normal range of motion. He exhibits no edema.       Lumbar back: He exhibits tenderness (Negative straight leg raise bilaterally). He exhibits normal range of motion.       Back:  Neurological: He is alert and oriented to person, place, and time. Coordination normal.  Skin: Skin is warm and dry. No rash noted. He is not diaphoretic.  Psychiatric: He has a normal mood and affect. His behavior is normal.  Nursing note and vitals reviewed.      Assessment & Plan:   Problem List Items Addressed This Visit    None    Visit Diagnoses    Chronic midline low back pain without sciatica    -  Primary   Relevant Medications   predniSONE (DELTASONE) 20 MG tablet   Other Relevant Orders  MR Lumbar Spine Wo Contrast   Chronic midline thoracic back pain       Relevant Medications   predniSONE (DELTASONE) 20 MG tablet   Other Relevant Orders   MR Lumbar Spine Wo Contrast       Follow up plan: Return if symptoms worsen or fail to improve.  Counseling provided for all of the vaccine components Orders Placed This Encounter  Procedures  . MR Lumbar Spine Wo Contrast    Arville Care, MD [facility] 05/07/2017, 8:43 AM

## 2017-05-09 DIAGNOSIS — M9903 Segmental and somatic dysfunction of lumbar region: Secondary | ICD-10-CM | POA: Diagnosis not present

## 2017-05-09 DIAGNOSIS — M9902 Segmental and somatic dysfunction of thoracic region: Secondary | ICD-10-CM | POA: Diagnosis not present

## 2017-05-09 DIAGNOSIS — M9904 Segmental and somatic dysfunction of sacral region: Secondary | ICD-10-CM | POA: Diagnosis not present

## 2017-05-09 DIAGNOSIS — M9901 Segmental and somatic dysfunction of cervical region: Secondary | ICD-10-CM | POA: Diagnosis not present

## 2017-05-14 DIAGNOSIS — M9901 Segmental and somatic dysfunction of cervical region: Secondary | ICD-10-CM | POA: Diagnosis not present

## 2017-05-14 DIAGNOSIS — M9903 Segmental and somatic dysfunction of lumbar region: Secondary | ICD-10-CM | POA: Diagnosis not present

## 2017-05-14 DIAGNOSIS — M9904 Segmental and somatic dysfunction of sacral region: Secondary | ICD-10-CM | POA: Diagnosis not present

## 2017-05-14 DIAGNOSIS — M9902 Segmental and somatic dysfunction of thoracic region: Secondary | ICD-10-CM | POA: Diagnosis not present

## 2017-05-15 ENCOUNTER — Ambulatory Visit (HOSPITAL_COMMUNITY)
Admission: RE | Admit: 2017-05-15 | Discharge: 2017-05-15 | Disposition: A | Discharge status: Home / Self Care | Payer: BLUE CROSS/BLUE SHIELD | Source: Ambulatory Visit | Attending: Family Medicine | Admitting: Family Medicine

## 2017-05-15 DIAGNOSIS — G8929 Other chronic pain: Secondary | ICD-10-CM | POA: Diagnosis not present

## 2017-05-15 DIAGNOSIS — M545 Low back pain: Secondary | ICD-10-CM | POA: Insufficient documentation

## 2017-05-15 DIAGNOSIS — M546 Pain in thoracic spine: Secondary | ICD-10-CM | POA: Insufficient documentation

## 2017-05-17 DIAGNOSIS — M9901 Segmental and somatic dysfunction of cervical region: Secondary | ICD-10-CM | POA: Diagnosis not present

## 2017-05-17 DIAGNOSIS — M9902 Segmental and somatic dysfunction of thoracic region: Secondary | ICD-10-CM | POA: Diagnosis not present

## 2017-05-17 DIAGNOSIS — M9904 Segmental and somatic dysfunction of sacral region: Secondary | ICD-10-CM | POA: Diagnosis not present

## 2017-05-17 DIAGNOSIS — M9903 Segmental and somatic dysfunction of lumbar region: Secondary | ICD-10-CM | POA: Diagnosis not present

## 2017-05-24 DIAGNOSIS — M9903 Segmental and somatic dysfunction of lumbar region: Secondary | ICD-10-CM | POA: Diagnosis not present

## 2017-05-24 DIAGNOSIS — M9902 Segmental and somatic dysfunction of thoracic region: Secondary | ICD-10-CM | POA: Diagnosis not present

## 2017-05-24 DIAGNOSIS — M9901 Segmental and somatic dysfunction of cervical region: Secondary | ICD-10-CM | POA: Diagnosis not present

## 2017-05-24 DIAGNOSIS — M9904 Segmental and somatic dysfunction of sacral region: Secondary | ICD-10-CM | POA: Diagnosis not present

## 2017-05-31 DIAGNOSIS — M9901 Segmental and somatic dysfunction of cervical region: Secondary | ICD-10-CM | POA: Diagnosis not present

## 2017-05-31 DIAGNOSIS — M9902 Segmental and somatic dysfunction of thoracic region: Secondary | ICD-10-CM | POA: Diagnosis not present

## 2017-05-31 DIAGNOSIS — M9903 Segmental and somatic dysfunction of lumbar region: Secondary | ICD-10-CM | POA: Diagnosis not present

## 2017-05-31 DIAGNOSIS — M9904 Segmental and somatic dysfunction of sacral region: Secondary | ICD-10-CM | POA: Diagnosis not present

## 2017-06-06 DIAGNOSIS — M9901 Segmental and somatic dysfunction of cervical region: Secondary | ICD-10-CM | POA: Diagnosis not present

## 2017-06-06 DIAGNOSIS — M9903 Segmental and somatic dysfunction of lumbar region: Secondary | ICD-10-CM | POA: Diagnosis not present

## 2017-06-06 DIAGNOSIS — M9902 Segmental and somatic dysfunction of thoracic region: Secondary | ICD-10-CM | POA: Diagnosis not present

## 2017-06-06 DIAGNOSIS — M9904 Segmental and somatic dysfunction of sacral region: Secondary | ICD-10-CM | POA: Diagnosis not present

## 2017-06-14 DIAGNOSIS — M9902 Segmental and somatic dysfunction of thoracic region: Secondary | ICD-10-CM | POA: Diagnosis not present

## 2017-06-14 DIAGNOSIS — M9903 Segmental and somatic dysfunction of lumbar region: Secondary | ICD-10-CM | POA: Diagnosis not present

## 2017-06-14 DIAGNOSIS — M9904 Segmental and somatic dysfunction of sacral region: Secondary | ICD-10-CM | POA: Diagnosis not present

## 2017-06-14 DIAGNOSIS — M9901 Segmental and somatic dysfunction of cervical region: Secondary | ICD-10-CM | POA: Diagnosis not present

## 2017-06-22 ENCOUNTER — Encounter: Payer: Self-pay | Admitting: Family Medicine

## 2017-06-22 ENCOUNTER — Ambulatory Visit (INDEPENDENT_AMBULATORY_CARE_PROVIDER_SITE_OTHER): Payer: BLUE CROSS/BLUE SHIELD | Admitting: Family Medicine

## 2017-06-22 VITALS — BP 110/78 | HR 84 | Temp 97.5°F | Ht 67.0 in | Wt 172.0 lb

## 2017-06-22 DIAGNOSIS — G4489 Other headache syndrome: Secondary | ICD-10-CM

## 2017-06-22 MED ORDER — CYCLOBENZAPRINE HCL 10 MG PO TABS: 10.0000 mg | ORAL_TABLET | Freq: Three times a day (TID) | ORAL | 1 refills | Status: DC | PRN

## 2017-06-22 MED ORDER — PREDNISONE 20 MG PO TABS: ORAL_TABLET | ORAL | 0 refills | Status: DC

## 2017-06-22 MED ORDER — RANITIDINE HCL 150 MG PO TABS: 150.0000 mg | ORAL_TABLET | Freq: Two times a day (BID) | ORAL | 2 refills | Status: DC

## 2017-06-22 NOTE — Progress Notes (Signed)
BP 110/78   Pulse 84   Temp (!) 97.5 F (36.4 C) (Oral)   Ht  (1.702 m)   Wt 172 lb (78 kg)   BMI 26.94 kg/m    Subjective:    Patient ID: Roy Scott, male    DOB: 17-Jun-1986, 31 y.o.   MRN: [id number]  HPI: Roy Scott is a 31 y.o. male presenting on 06/22/2017 for Headache (periodically having headaches in the center of his forehead; happens during the day and gets much worse at night, comes with nausea and sweats)   HPI Headache syndrome Patient is coming in with complaints of headache that he has been fighting over the past month. He feels like they're worsening and getting more frequent. He says he will fill headache that is in the center of his forehead mostly in the evenings when he is either sitting or laying down. The headache he rates as intense and often 7 or 8 out of 10. He has tried Tylenol ibuprofen for it which do help some and then he will go to sleep and then will go away. He denies any fevers or chills. He does complain of congestion that he chronically has his allergies in both his sinuses and his nose. He denies any headache when he is up and moving around during the daytime. He denies any focal numbness or weakness.   Relevant past medical, surgical, family and social history reviewed and updated as indicated. Interim medical history since our last visit reviewed. Allergies and medications reviewed and updated.  Review of Systems  Constitutional: Negative for chills and fever.  HENT: Positive for congestion and sinus pressure.   Respiratory: Negative for cough, shortness of breath and wheezing.   Cardiovascular: Negative for chest pain and leg swelling.  Musculoskeletal: Negative for back pain and gait problem.  Skin: Negative for rash.  Neurological: Positive for headaches. Negative for dizziness, facial asymmetry, speech difficulty, weakness, light-headedness and numbness.  All other systems reviewed and are negative.   Per HPI unless specifically  indicated above        Objective:    BP 110/78   Pulse 84   Temp (!) 97.5 F (36.4 C) (Oral)   Ht  (1.702 m)   Wt 172 lb (78 kg)   BMI 26.94 kg/m   Wt Readings from Last 3 Encounters:  06/22/17 172 lb (78 kg)  05/07/17 174 lb (78.9 kg)  04/12/17 170 lb (77.1 kg)    Physical Exam  Constitutional: He is oriented to person, place, and time. He appears well-developed and well-nourished. No distress.  HENT:  Head: Normocephalic and atraumatic.  Right Ear: Tympanic membrane and ear canal normal.  Left Ear: Tympanic membrane and ear canal normal.  Nose: Nose normal.  Mouth/Throat: Oropharynx is clear and moist and mucous membranes are normal. No oropharyngeal exudate.  Eyes: Pupils are equal, round, and reactive to light. Conjunctivae and EOM are normal. No scleral icterus.  Neck: Neck supple. No thyromegaly present.  Cardiovascular: Normal rate, regular rhythm, normal heart sounds and intact distal pulses.   No murmur heard. Pulmonary/Chest: Effort normal and breath sounds normal. No respiratory distress. He has no wheezes. He has no rales.  Musculoskeletal: Normal range of motion. He exhibits no edema or tenderness.  Lymphadenopathy:    He has no cervical adenopathy.  Neurological: He is alert and oriented to person, place, and time. No cranial nerve deficit. He exhibits normal muscle tone. Coordination normal.  Skin: Skin is  warm and dry. No rash noted. He is not diaphoretic.  Psychiatric: He has a normal mood and affect. His behavior is normal.  Nursing note and vitals reviewed.       Assessment & Plan:   Problem List Items Addressed This Visit    None    Visit Diagnoses    Other headache syndrome    -  Primary   Could be sinuses, will try short course of prednisone, if not improved then we'll send to neurology   Relevant Medications   cyclobenzaprine (FLEXERIL) 10 MG tablet       Follow up plan: Return if symptoms worsen or fail to improve.  Counseling  provided for all of the vaccine components No orders of the defined types were placed in this encounter.   Arville Care, MD [facility] 06/22/2017, 1:34 PM

## 2017-07-03 ENCOUNTER — Telehealth: Payer: Self-pay | Admitting: Family Medicine

## 2017-07-03 DIAGNOSIS — R519 Headache, unspecified: Secondary | ICD-10-CM

## 2017-07-03 DIAGNOSIS — G4489 Other headache syndrome: Secondary | ICD-10-CM

## 2017-07-03 DIAGNOSIS — R51 Headache: Secondary | ICD-10-CM

## 2017-07-03 DIAGNOSIS — G43009 Migraine without aura, not intractable, without status migrainosus: Secondary | ICD-10-CM

## 2017-07-03 NOTE — Telephone Encounter (Signed)
Pt called -  He is still having HAs - you has finished all 5 days of prednisone - anything else he can do  He is also nauseated, has cold sweats and diarrhea and vomiting. He is aware to do clear fluids for 24 hours  He is aware that DR D will not be in until WED am    Please advise

## 2017-07-04 NOTE — Telephone Encounter (Signed)
Please order an MRI of brain and do a referral to neurology, diagnosis headache syndrome and atypical migraine

## 2017-07-04 NOTE — Telephone Encounter (Signed)
Pt aware of orders

## 2017-07-05 ENCOUNTER — Telehealth: Payer: Self-pay | Admitting: Family Medicine

## 2017-07-05 MED ORDER — OMEPRAZOLE 40 MG PO CPDR: 40.0000 mg | DELAYED_RELEASE_CAPSULE | Freq: Every day | ORAL | 3 refills | Status: DC

## 2017-07-05 NOTE — Telephone Encounter (Signed)
Patient states that his stools are black. He states that he has a burning sensation in stomach and chest.

## 2017-07-05 NOTE — Addendum Note (Signed)
Addended by: Arville Care on: 07/05/2017 03:07 PM   Modules accepted: Orders

## 2017-07-06 NOTE — Telephone Encounter (Signed)
Patient aware and verbalizes understanding. 

## 2017-07-13 ENCOUNTER — Encounter (HOSPITAL_COMMUNITY): Payer: Self-pay

## 2017-07-13 ENCOUNTER — Ambulatory Visit (HOSPITAL_COMMUNITY)
Admission: RE | Admit: 2017-07-13 | Discharge: 2017-07-13 | Disposition: A | Discharge status: Home / Self Care | Payer: BLUE CROSS/BLUE SHIELD | Source: Ambulatory Visit | Attending: Family Medicine | Admitting: Family Medicine

## 2017-07-13 DIAGNOSIS — G4489 Other headache syndrome: Secondary | ICD-10-CM

## 2017-07-13 DIAGNOSIS — R519 Headache, unspecified: Secondary | ICD-10-CM

## 2017-07-13 DIAGNOSIS — G43009 Migraine without aura, not intractable, without status migrainosus: Secondary | ICD-10-CM

## 2017-07-13 DIAGNOSIS — R51 Headache: Principal | ICD-10-CM

## 2017-07-23 ENCOUNTER — Ambulatory Visit (HOSPITAL_COMMUNITY)
Admission: RE | Admit: 2017-07-23 | Discharge: 2017-07-23 | Disposition: A | Discharge status: Home / Self Care | Payer: BLUE CROSS/BLUE SHIELD | Source: Ambulatory Visit | Attending: Family Medicine | Admitting: Family Medicine

## 2017-07-23 DIAGNOSIS — G4489 Other headache syndrome: Secondary | ICD-10-CM | POA: Diagnosis not present

## 2017-07-23 DIAGNOSIS — R600 Localized edema: Secondary | ICD-10-CM | POA: Insufficient documentation

## 2017-07-23 DIAGNOSIS — G43009 Migraine without aura, not intractable, without status migrainosus: Secondary | ICD-10-CM | POA: Diagnosis not present

## 2017-07-23 DIAGNOSIS — R51 Headache: Secondary | ICD-10-CM | POA: Diagnosis not present

## 2017-07-25 ENCOUNTER — Other Ambulatory Visit: Payer: Self-pay | Admitting: *Deleted

## 2017-07-25 MED ORDER — PREDNISONE 20 MG PO TABS: ORAL_TABLET | ORAL | 0 refills | Status: DC

## 2017-07-25 NOTE — Progress Notes (Signed)
Patient aware rx sent to pharmacy.  

## 2017-08-21 ENCOUNTER — Encounter: Payer: Self-pay | Admitting: Physician Assistant

## 2017-08-21 ENCOUNTER — Ambulatory Visit: Payer: BLUE CROSS/BLUE SHIELD | Admitting: Physician Assistant

## 2017-08-21 VITALS — BP 120/76 | HR 88 | Temp 97.7°F | Ht 67.0 in | Wt 174.6 lb

## 2017-08-21 DIAGNOSIS — R51 Headache: Secondary | ICD-10-CM

## 2017-08-21 DIAGNOSIS — J309 Allergic rhinitis, unspecified: Secondary | ICD-10-CM | POA: Diagnosis not present

## 2017-08-21 DIAGNOSIS — F32A Depression, unspecified: Secondary | ICD-10-CM

## 2017-08-21 DIAGNOSIS — K219 Gastro-esophageal reflux disease without esophagitis: Secondary | ICD-10-CM | POA: Insufficient documentation

## 2017-08-21 DIAGNOSIS — F329 Major depressive disorder, single episode, unspecified: Secondary | ICD-10-CM

## 2017-08-21 DIAGNOSIS — R519 Headache, unspecified: Secondary | ICD-10-CM

## 2017-08-21 MED ORDER — ALPRAZOLAM 0.5 MG PO TABS: 0.5000 mg | ORAL_TABLET | Freq: Two times a day (BID) | ORAL | 0 refills | Status: DC | PRN

## 2017-08-21 MED ORDER — IPRATROPIUM BROMIDE 0.03 % NA SOLN: 2.0000 | Freq: Two times a day (BID) | NASAL | 12 refills | Status: DC

## 2017-08-21 MED ORDER — CITALOPRAM HYDROBROMIDE 20 MG PO TABS: 20.0000 mg | ORAL_TABLET | Freq: Every day | ORAL | 5 refills | Status: DC

## 2017-08-21 MED ORDER — CETIRIZINE HCL 10 MG PO TABS: 10.0000 mg | ORAL_TABLET | Freq: Every day | ORAL | 11 refills | Status: DC

## 2017-08-21 MED ORDER — BUDESONIDE 32 MCG/ACT NA SUSP: 1.0000 | Freq: Two times a day (BID) | NASAL | 11 refills | Status: DC

## 2017-08-21 NOTE — Patient Instructions (Signed)
In a few days you may receive a survey in the mail or online from [employer] regarding your visit with us today. Please take a moment to fill this out. Your feedback is very important to our whole office. It can help us better understand your needs as well as improve your experience and satisfaction. Thank you for taking your time to complete it. We care about you.  Jillian Warth, PA-C  

## 2017-08-22 ENCOUNTER — Encounter: Payer: Self-pay | Admitting: Internal Medicine

## 2017-08-22 NOTE — Progress Notes (Signed)
BP 120/76   Pulse 88   Temp 97.7 F (36.5 C) (Oral)   Ht 5\' 7"  (1.702 m)   Wt 174 lb 9.6 oz (79.2 kg)   BMI 27.35 kg/m    Subjective:    Patient ID: Roy Scott, male    DOB: 01-Aug-1986, 31 y.o.   MRN: [id number]  HPI: Roy Scott is a 31 y.o. male presenting on 08/21/2017 for GI Problem (Patient states he also has a weird sensation in his head, has like a hot flash and the he experiences heat on his neck) and Anxiety  Patient has a long array of symptoms.  He states that his stomach bothers him and he has decreased appetite.  Has been over the past 2-3 months.  He has had GERD over the years.  He states that Prilosec has helped better than Zantac.  I encouraged him to go back to this.  Each morning the nausea is present.  He states that ginger ale has made it worse.  Patient also has a weird sensation in the back of his head he will have a hot flash in the heat will go down the back of his neck.  He states that he feels a hot he gets an ice pack to put on it.  He has had a history of a pinched nerve over the past year.  He states that he will have these headaches sensation several times a day and will only last for a few minutes.  He even feels slightly syncopal when that happens.  The patient also has long-standing allergic rhinitis and sinusitis problems.  He is currently not taking any medication for this.  There is some antihistamines in the past that made him stay awake.  We are going to try Zyrtec and Rhinocort at this time to see if it will help.  He does have a neurology appointment coming up soon to evaluate for the headache.  Due to his long-standing reflux we have discussed having a gastroenterology evaluation also   Relevant past medical, surgical, family and social history reviewed and updated as indicated. Allergies and medications reviewed and updated.  Past Medical History:  Diagnosis Date  . Asthma     Past Surgical History:  Procedure Laterality Date  .  APPENDECTOMY    . FRACTURE SURGERY    . TYMPANOSTOMY TUBE PLACEMENT      Review of Systems  Constitutional: Positive for activity change and appetite change. Negative for chills, fatigue and fever.  HENT: Positive for congestion, postnasal drip and rhinorrhea. Negative for sore throat and tinnitus.   Eyes: Negative.  Negative for pain and visual disturbance.  Respiratory: Negative.  Negative for cough, chest tightness, shortness of breath, wheezing and stridor.   Cardiovascular: Negative.  Negative for chest pain, palpitations and leg swelling.  Gastrointestinal: Positive for nausea. Negative for abdominal pain, constipation, diarrhea and vomiting.  Endocrine: Negative.   Genitourinary: Negative.   Musculoskeletal: Negative.   Skin: Negative.  Negative for color change and rash.  Neurological: Positive for dizziness, syncope and headaches. Negative for tremors, weakness and numbness.  Psychiatric/Behavioral: Negative for sleep disturbance and suicidal ideas. The patient is nervous/anxious.     Allergies as of 08/21/2017      Reactions   Ceclor [cefaclor]    Penicillins    Sulfa Antibiotics    Latex Rash      Medication List        Accurate as of 08/21/17 11:59 PM. Always use  your most recent med list.          albuterol 108 (90 Base) MCG/ACT inhaler Commonly known as:  PROAIR HFA Inhale 1 puff into the lungs every 4 (four) hours as needed for wheezing or shortness of breath.   ALPRAZolam 0.5 MG tablet Commonly known as:  XANAX Take 1 tablet (0.5 mg total) by mouth 2 (two) times daily as needed for anxiety.   budesonide 32 MCG/ACT nasal spray Commonly known as:  RHINOCORT AQUA Place 1 spray into both nostrils 2 (two) times daily.   cetirizine 10 MG tablet Commonly known as:  ZYRTEC ALLERGY Take 1 tablet (10 mg total) by mouth daily.   citalopram 20 MG tablet Commonly known as:  CELEXA Take 1 tablet (20 mg total) by mouth daily.   cyclobenzaprine 10 MG  tablet Commonly known as:  FLEXERIL Take 1 tablet (10 mg total) by mouth 3 (three) times daily as needed for muscle spasms.   Fluticasone-Salmeterol 250-50 MCG/DOSE Aepb Commonly known as:  ADVAIR DISKUS Inhale 1 puff into the lungs 2 (two) times daily at 10 AM and 5 PM.   ibuprofen 200 MG tablet Commonly known as:  ADVIL,MOTRIN Take 400 mg by mouth every 6 (six) hours as needed.   ipratropium 0.03 % nasal spray Commonly known as:  ATROVENT Place 2 sprays into both nostrils every 12 (twelve) hours.   ranitidine 150 MG tablet Commonly known as:  ZANTAC Take 1 tablet (150 mg total) by mouth 2 (two) times daily.          Objective:    BP 120/76   Pulse 88   Temp 97.7 F (36.5 C) (Oral)   Ht 5\' 7"  (1.702 m)   Wt 174 lb 9.6 oz (79.2 kg)   BMI 27.35 kg/m   Allergies  Allergen Reactions  . Ceclor [Cefaclor]   . Penicillins   . Sulfa Antibiotics   . Latex Rash    Physical Exam  Constitutional: He appears well-developed and well-nourished.  HENT:  Head: Normocephalic and atraumatic.  Eyes: Conjunctivae and EOM are normal. Pupils are equal, round, and reactive to light.  Neck: Normal range of motion. Neck supple.  Cardiovascular: Normal rate, regular rhythm and normal heart sounds.  Pulmonary/Chest: Effort normal and breath sounds normal.  Abdominal: Soft. Bowel sounds are normal.  Musculoskeletal:       Cervical back: He exhibits decreased range of motion and tenderness.       Back:  Skin: Skin is warm and dry.        Assessment & Plan:   1. Gastroesophageal reflux disease, esophagitis presence not specified - Ambulatory referral to Gastroenterology  2. Allergic rhinitis, unspecified seasonality, unspecified trigger - cetirizine (ZYRTEC ALLERGY) 10 MG tablet; Take 1 tablet (10 mg total) by mouth daily.  Dispense: 30 tablet; Refill: 11 - ipratropium (ATROVENT) 0.03 % nasal spray; Place 2 sprays into both nostrils every 12 (twelve) hours.  Dispense: 30 mL;  Refill: 12 - budesonide (RHINOCORT AQUA) 32 MCG/ACT nasal spray; Place 1 spray into both nostrils 2 (two) times daily.  Dispense: 8.6 g; Refill: 11  3. Nonintractable headache, unspecified chronicity pattern, unspecified headache type  4. Depression, unspecified depression type - citalopram (CELEXA) 20 MG tablet; Take 1 tablet (20 mg total) by mouth daily.  Dispense: 30 tablet; Refill: 5 - ALPRAZolam (XANAX) 0.5 MG tablet; Take 1 tablet (0.5 mg total) by mouth 2 (two) times daily as needed for anxiety.  Dispense: 30 tablet; Refill: 0  Current Outpatient Medications:  .  albuterol (PROAIR HFA) 108 (90 Base) MCG/ACT inhaler, Inhale 1 puff into the lungs every 4 (four) hours as needed for wheezing or shortness of breath., Disp: 18 Inhaler, Rfl: 5 .  cyclobenzaprine (FLEXERIL) 10 MG tablet, Take 1 tablet (10 mg total) by mouth 3 (three) times daily as needed for muscle spasms., Disp: 60 tablet, Rfl: 1 .  Fluticasone-Salmeterol (ADVAIR DISKUS) 250-50 MCG/DOSE AEPB, Inhale 1 puff into the lungs 2 (two) times daily at 10 AM and 5 PM., Disp: 60 each, Rfl: 3 .  ibuprofen (ADVIL,MOTRIN) 200 MG tablet, Take 400 mg by mouth every 6 (six) hours as needed., Disp: , Rfl:  .  ranitidine (ZANTAC) 150 MG tablet, Take 1 tablet (150 mg total) by mouth 2 (two) times daily., Disp: 60 tablet, Rfl: 2 .  ALPRAZolam (XANAX) 0.5 MG tablet, Take 1 tablet (0.5 mg total) by mouth 2 (two) times daily as needed for anxiety., Disp: 30 tablet, Rfl: 0 .  budesonide (RHINOCORT AQUA) 32 MCG/ACT nasal spray, Place 1 spray into both nostrils 2 (two) times daily., Disp: 8.6 g, Rfl: 11 .  cetirizine (ZYRTEC ALLERGY) 10 MG tablet, Take 1 tablet (10 mg total) by mouth daily., Disp: 30 tablet, Rfl: 11 .  citalopram (CELEXA) 20 MG tablet, Take 1 tablet (20 mg total) by mouth daily., Disp: 30 tablet, Rfl: 5 .  ipratropium (ATROVENT) 0.03 % nasal spray, Place 2 sprays into both nostrils every 12 (twelve) hours., Disp: 30 mL, Rfl:  12 Continue all other maintenance medications as listed above.  Follow up plan: Return in about 4 weeks (around 09/18/2017) for recheck.  Educational handout given for survey  Roy Loffler PA-C [facility] [address]  [place], [place] [address] [phone]   08/22/2017, 12:38 PM

## 2017-08-28 ENCOUNTER — Ambulatory Visit: Payer: BLUE CROSS/BLUE SHIELD | Admitting: Family Medicine

## 2017-09-08 ENCOUNTER — Encounter: Payer: Self-pay | Admitting: Family Medicine

## 2017-09-11 ENCOUNTER — Telehealth: Payer: Self-pay | Admitting: Neurology

## 2017-09-11 ENCOUNTER — Ambulatory Visit: Payer: BLUE CROSS/BLUE SHIELD | Admitting: Neurology

## 2017-09-11 NOTE — Telephone Encounter (Signed)
This patient did not show for a new patient appointment today. 

## 2017-09-12 ENCOUNTER — Encounter: Payer: Self-pay | Admitting: Neurology

## 2017-09-13 ENCOUNTER — Ambulatory Visit: Payer: BLUE CROSS/BLUE SHIELD | Admitting: Physician Assistant

## 2017-09-13 ENCOUNTER — Encounter: Payer: Self-pay | Admitting: Physician Assistant

## 2017-09-13 DIAGNOSIS — J309 Allergic rhinitis, unspecified: Secondary | ICD-10-CM

## 2017-09-13 DIAGNOSIS — F32A Depression, unspecified: Secondary | ICD-10-CM

## 2017-09-13 DIAGNOSIS — F329 Major depressive disorder, single episode, unspecified: Secondary | ICD-10-CM | POA: Diagnosis not present

## 2017-09-13 MED ORDER — ALPRAZOLAM 0.5 MG PO TABS: 0.5000 mg | ORAL_TABLET | Freq: Two times a day (BID) | ORAL | 2 refills | Status: DC | PRN

## 2017-09-13 MED ORDER — CITALOPRAM HYDROBROMIDE 40 MG PO TABS: 20.0000 mg | ORAL_TABLET | Freq: Every day | ORAL | 5 refills | Status: DC

## 2017-09-13 MED ORDER — BUDESONIDE 32 MCG/ACT NA SUSP: 1.0000 | Freq: Two times a day (BID) | NASAL | 11 refills | Status: DC

## 2017-09-13 NOTE — Patient Instructions (Signed)
In a few days you may receive a survey in the mail or online from [employer] regarding your visit with us today. Please take a moment to fill this out. Your feedback is very important to our whole office. It can help us better understand your needs as well as improve your experience and satisfaction. Thank you for taking your time to complete it. We care about you.  Taiven Greenley, PA-C  

## 2017-09-18 NOTE — Progress Notes (Signed)
BP 118/74   Pulse 74   Temp (!) 97.1 F (36.2 C) (Oral)   Ht 5\' 7"  (1.702 m)   Wt 178 lb (80.7 kg)   BMI 27.88 kg/m    Subjective:    Patient ID: Roy Scott, male    DOB: [birth year], 31 y.o.   MRN: [id number]  HPI: Roy Scott is a 31 y.o. male presenting on 09/13/2017 for Follow-up (4 week )  This patient comes in for periodic recheck on medications and conditions including depression, anxiety and allergies. He reports a great decrease in his daily anxious feelings and in specific anxiety attacks.  He was able to go out to places he had avoided due to anxiety.  Also allergies are slightly better.  [facility] 09/13/2017 08/21/2017 06/22/2017 05/07/2017 04/12/2017  Decreased Interest 0 1 0 0 0  Down, Depressed, Hopeless 1 0 0 0 0  PHQ - 2 Score 1 1 0 0 0    All medications are reviewed today. There are no reports of any problems with the medications. All of the medical conditions are reviewed and updated.  Lab work is reviewed and will be ordered as medically necessary. There are no new problems reported with today's visit.   Relevant past medical, surgical, family and social history reviewed and updated as indicated. Allergies and medications reviewed and updated.  Past Medical History:  Diagnosis Date  . Asthma     Past Surgical History:  Procedure Laterality Date  . APPENDECTOMY    . FRACTURE SURGERY    . TYMPANOSTOMY TUBE PLACEMENT      Review of Systems  Constitutional: Negative.  Negative for appetite change and fatigue.  HENT: Negative.   Eyes: Negative.  Negative for pain and visual disturbance.  Respiratory: Negative.  Negative for cough, chest tightness, shortness of breath and wheezing.   Cardiovascular: Negative.  Negative for chest pain, palpitations and leg swelling.  Gastrointestinal: Negative.  Negative for abdominal pain, diarrhea, nausea and vomiting.  Endocrine: Negative.   Genitourinary: Negative.   Musculoskeletal: Negative.     Skin: Negative.  Negative for color change and rash.  Neurological: Negative.  Negative for weakness, numbness and headaches.  Psychiatric/Behavioral: Negative.     Allergies as of 09/13/2017      Reactions   Ceclor [cefaclor]    Penicillins    Sulfa Antibiotics    Latex Rash      Medication List        Accurate as of 09/13/17 11:59 PM. Always use your most recent med list.          albuterol 108 (90 Base) MCG/ACT inhaler Commonly known as:  PROAIR HFA Inhale 1 puff into the lungs every 4 (four) hours as needed for wheezing or shortness of breath.   ALPRAZolam 0.5 MG tablet Commonly known as:  XANAX Take 1 tablet (0.5 mg total) by mouth 2 (two) times daily as needed for anxiety.   budesonide 32 MCG/ACT nasal spray Commonly known as:  RHINOCORT AQUA Place 1 spray into both nostrils 2 (two) times daily.   cetirizine 10 MG tablet Commonly known as:  ZYRTEC ALLERGY Take 1 tablet (10 mg total) by mouth daily.   citalopram 40 MG tablet Commonly known as:  CELEXA Take 0.5 tablets (20 mg total) by mouth daily.   cyclobenzaprine 10 MG tablet Commonly known as:  FLEXERIL Take 1 tablet (10 mg total) by mouth 3 (three) times daily as needed for muscle spasms.  Fluticasone-Salmeterol 250-50 MCG/DOSE Aepb Commonly known as:  ADVAIR DISKUS Inhale 1 puff into the lungs 2 (two) times daily at 10 AM and 5 PM.   ibuprofen 200 MG tablet Commonly known as:  ADVIL,MOTRIN Take 400 mg by mouth every 6 (six) hours as needed.   ipratropium 0.03 % nasal spray Commonly known as:  ATROVENT Place 2 sprays into both nostrils every 12 (twelve) hours.   ranitidine 150 MG tablet Commonly known as:  ZANTAC Take 1 tablet (150 mg total) by mouth 2 (two) times daily.          Objective:    BP 118/74   Pulse 74   Temp (!) 97.1 F (36.2 C) (Oral)   Ht 5\' 7"  (1.702 m)   Wt 178 lb (80.7 kg)   BMI 27.88 kg/m   Allergies  Allergen Reactions  . Ceclor [Cefaclor]   . Penicillins    . Sulfa Antibiotics   . Latex Rash    Physical Exam  Constitutional: He appears well-developed and well-nourished. No distress.  HENT:  Head: Normocephalic and atraumatic.  Eyes: Conjunctivae and EOM are normal. Pupils are equal, round, and reactive to light.  Cardiovascular: Normal rate, regular rhythm and normal heart sounds.  Pulmonary/Chest: Effort normal and breath sounds normal. No respiratory distress.  Skin: Skin is warm and dry.  Psychiatric: He has a normal mood and affect. His behavior is normal.  Nursing note and vitals reviewed.       Assessment & Plan:   1. Allergic rhinitis, unspecified seasonality, unspecified trigger - budesonide (RHINOCORT AQUA) 32 MCG/ACT nasal spray; Place 1 spray into both nostrils 2 (two) times daily.  Dispense: 8.6 g; Refill: 11  2. Depression, unspecified depression type - citalopram (CELEXA) 40 MG tablet; Take 0.5 tablets (20 mg total) by mouth daily.  Dispense: 30 tablet; Refill: 5 - ALPRAZolam (XANAX) 0.5 MG tablet; Take 1 tablet (0.5 mg total) by mouth 2 (two) times daily as needed for anxiety.  Dispense: 30 tablet; Refill: 2    Current Outpatient Medications:  .  albuterol (PROAIR HFA) 108 (90 Base) MCG/ACT inhaler, Inhale 1 puff into the lungs every 4 (four) hours as needed for wheezing or shortness of breath., Disp: 18 Inhaler, Rfl: 5 .  ALPRAZolam (XANAX) 0.5 MG tablet, Take 1 tablet (0.5 mg total) by mouth 2 (two) times daily as needed for anxiety., Disp: 30 tablet, Rfl: 2 .  budesonide (RHINOCORT AQUA) 32 MCG/ACT nasal spray, Place 1 spray into both nostrils 2 (two) times daily., Disp: 8.6 g, Rfl: 11 .  cetirizine (ZYRTEC ALLERGY) 10 MG tablet, Take 1 tablet (10 mg total) by mouth daily., Disp: 30 tablet, Rfl: 11 .  citalopram (CELEXA) 40 MG tablet, Take 0.5 tablets (20 mg total) by mouth daily., Disp: 30 tablet, Rfl: 5 .  cyclobenzaprine (FLEXERIL) 10 MG tablet, Take 1 tablet (10 mg total) by mouth 3 (three) times daily as  needed for muscle spasms., Disp: 60 tablet, Rfl: 1 .  Fluticasone-Salmeterol (ADVAIR DISKUS) 250-50 MCG/DOSE AEPB, Inhale 1 puff into the lungs 2 (two) times daily at 10 AM and 5 PM., Disp: 60 each, Rfl: 3 .  ibuprofen (ADVIL,MOTRIN) 200 MG tablet, Take 400 mg by mouth every 6 (six) hours as needed., Disp: , Rfl:  .  ipratropium (ATROVENT) 0.03 % nasal spray, Place 2 sprays into both nostrils every 12 (twelve) hours., Disp: 30 mL, Rfl: 12 .  ranitidine (ZANTAC) 150 MG tablet, Take 1 tablet (150 mg total) by mouth 2 (  two) times daily., Disp: 60 tablet, Rfl: 2 Continue all other maintenance medications as listed above.  Follow up plan: Return in about 3 months (around 12/12/2017) for recheck.  Educational handout given for survey  Remus Loffler PA-C [facility] [address]  [place], [place] [address] [phone]   09/18/2017, 9:50 PM

## 2017-09-21 ENCOUNTER — Encounter: Payer: Self-pay | Admitting: Physician Assistant

## 2017-09-21 ENCOUNTER — Ambulatory Visit: Payer: BLUE CROSS/BLUE SHIELD | Admitting: Physician Assistant

## 2017-09-21 ENCOUNTER — Other Ambulatory Visit: Payer: Self-pay | Admitting: Physician Assistant

## 2017-09-21 VITALS — BP 133/85 | HR 92 | Temp 96.9°F | Ht 67.0 in | Wt 174.2 lb

## 2017-09-21 DIAGNOSIS — J309 Allergic rhinitis, unspecified: Secondary | ICD-10-CM

## 2017-09-21 DIAGNOSIS — M9901 Segmental and somatic dysfunction of cervical region: Secondary | ICD-10-CM | POA: Insufficient documentation

## 2017-09-21 DIAGNOSIS — G4489 Other headache syndrome: Secondary | ICD-10-CM | POA: Diagnosis not present

## 2017-09-21 DIAGNOSIS — G629 Polyneuropathy, unspecified: Secondary | ICD-10-CM | POA: Diagnosis not present

## 2017-09-21 MED ORDER — BUDESONIDE 32 MCG/ACT NA SUSP: 1.0000 | Freq: Two times a day (BID) | NASAL | 11 refills | Status: DC

## 2017-09-21 NOTE — Patient Instructions (Signed)
In a few days you may receive a survey in the mail or online from [employer] regarding your visit with us today. Please take a moment to fill this out. Your feedback is very important to our whole office. It can help us better understand your needs as well as improve your experience and satisfaction. Thank you for taking your time to complete it. We care about you.  Dalayza Zambrana, PA-C  
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
## 2017-09-28 DIAGNOSIS — Z0289 Encounter for other administrative examinations: Secondary | ICD-10-CM

## 2017-10-08 ENCOUNTER — Encounter: Payer: Self-pay | Admitting: *Deleted

## 2017-10-17 ENCOUNTER — Ambulatory Visit: Payer: BLUE CROSS/BLUE SHIELD | Admitting: Internal Medicine

## 2017-10-22 ENCOUNTER — Encounter: Payer: Self-pay | Admitting: Physician Assistant

## 2017-11-01 DIAGNOSIS — M9901 Segmental and somatic dysfunction of cervical region: Secondary | ICD-10-CM | POA: Diagnosis not present

## 2017-11-01 DIAGNOSIS — M9904 Segmental and somatic dysfunction of sacral region: Secondary | ICD-10-CM | POA: Diagnosis not present

## 2017-11-01 DIAGNOSIS — M9902 Segmental and somatic dysfunction of thoracic region: Secondary | ICD-10-CM | POA: Diagnosis not present

## 2017-11-01 DIAGNOSIS — M9903 Segmental and somatic dysfunction of lumbar region: Secondary | ICD-10-CM | POA: Diagnosis not present

## 2017-11-13 ENCOUNTER — Encounter: Payer: Self-pay | Admitting: Allergy and Immunology

## 2017-11-13 ENCOUNTER — Ambulatory Visit: Payer: BLUE CROSS/BLUE SHIELD | Admitting: Allergy and Immunology

## 2017-11-13 VITALS — BP 114/80 | HR 88 | Temp 97.5°F | Resp 20 | Ht 65.5 in | Wt 176.6 lb

## 2017-11-13 DIAGNOSIS — J3089 Other allergic rhinitis: Secondary | ICD-10-CM

## 2017-11-13 DIAGNOSIS — J4530 Mild persistent asthma, uncomplicated: Secondary | ICD-10-CM

## 2017-11-13 DIAGNOSIS — G43909 Migraine, unspecified, not intractable, without status migrainosus: Secondary | ICD-10-CM

## 2017-11-13 DIAGNOSIS — Z7729 Contact with and (suspected ) exposure to other hazardous substances: Secondary | ICD-10-CM

## 2017-11-13 DIAGNOSIS — K219 Gastro-esophageal reflux disease without esophagitis: Secondary | ICD-10-CM | POA: Diagnosis not present

## 2017-11-13 MED ORDER — RANITIDINE HCL 300 MG PO TABS: 300.0000 mg | ORAL_TABLET | Freq: Every day | ORAL | 5 refills | Status: DC

## 2017-11-13 MED ORDER — MOMETASONE FUROATE 220 MCG/INH IN AEPB: 2.0000 | INHALATION_SPRAY | Freq: Every day | RESPIRATORY_TRACT | 5 refills | Status: DC

## 2017-11-13 MED ORDER — DEXLANSOPRAZOLE 60 MG PO CPDR: 60.0000 mg | DELAYED_RELEASE_CAPSULE | Freq: Every day | ORAL | 5 refills | Status: DC

## 2017-11-13 NOTE — Progress Notes (Signed)
Dear Prudy Feeler,  Thank you for referring Roy Scott to the [facility] [place] on 11/13/2017.   Below is a summation of this patient's evaluation and recommendations.  Thank you for your referral. I will keep you informed about this patient's response to treatment.   If you have any questions please do not hesitate to contact me.   Sincerely,  Jessica Priest, MD Allergy / Immunology [facility]   ______________________________________________________________________    NEW PATIENT NOTE  Referring Provider: Remus Loffler, PA-C Primary Provider: Dettinger, Elige Radon, MD Date of office visit: 11/13/2017    Subjective:   Chief Complaint:  Roy Scott (DOB: 08-31-1986) is a 32 y.o. male who presents to the clinic on 11/13/2017 with a chief complaint of Heartburn; Allergic Rhinitis ; and Nasal Congestion .     HPI: Trason presents to this clinic in evaluation of persistent respiratory tract symptoms.  He appears to have 3 distinct respiratory syndromes.  First, he has constant postnasal drip and throat clearing.  He has a very easy gag reflex and will vomit easily when he is brushing his teeth.  He has heartburn and regurgitation on a regular basis.  This occurs even though he is using Zantac.  He does drink caffeine daily about 4 cups/day.  Second, he has nasal congestion and sneezing and intermittent anosmia. These symptoms occur on a perennial basis and flare during the spring and fall.  Outdoor exposure may precipitate these issues.  He has tried various antihistamines which have not helped him and is presently using ipratropium nasal spray for some of his drainage which does not help.  Third, he carries a diagnosis of asthma for many years.  He believes that his asthma is "stable".  But he does use a short acting bronchodilator about 1 or 2 times per week usually in the evening.  He does  not use his prescribed Advair consistently averaging out to 1 time per month.  In addition, he has really bad headaches.  He has a long history of headaches but they become quite severe recently.  He is scheduled to see a neurologist regarding this issue.  Past Medical History:  Diagnosis Date  . Anxiety   . Asthma   . GERD (gastroesophageal reflux disease)   . Laryngopharyngeal reflux (LPR)     Past Surgical History:  Procedure Laterality Date  . APPENDECTOMY    . FRACTURE SURGERY    . TYMPANOSTOMY TUBE PLACEMENT      Allergies as of 11/13/2017      Reactions   Ceclor [cefaclor]    Penicillins    Sulfa Antibiotics    Latex Rash      Medication List      albuterol 108 (90 Base) MCG/ACT inhaler Commonly known as:  PROAIR HFA Inhale 1 puff into the lungs every 4 (four) hours as needed for wheezing or shortness of breath.   ALPRAZolam 0.5 MG tablet Commonly known as:  XANAX Take 1 tablet (0.5 mg total) by mouth 2 (two) times daily as needed for anxiety.   budesonide 32 MCG/ACT nasal spray Commonly known as:  RHINOCORT AQUA Place 1 spray into both nostrils 2 (two) times daily.   cetirizine 10 MG tablet Commonly known as:  ZYRTEC ALLERGY Take 1 tablet (10 mg total) by mouth daily.   cyclobenzaprine 10 MG tablet Commonly known as:  FLEXERIL Take 1 tablet (10  mg total) by mouth 3 (three) times daily as needed for muscle spasms.   Fluticasone-Salmeterol 250-50 MCG/DOSE Aepb Commonly known as:  ADVAIR DISKUS Inhale 1 puff into the lungs 2 (two) times daily at 10 AM and 5 PM.   ibuprofen 200 MG tablet Commonly known as:  ADVIL,MOTRIN Take 400 mg by mouth every 6 (six) hours as needed.   ipratropium 0.03 % nasal spray Commonly known as:  ATROVENT Place 2 sprays into both nostrils every 12 (twelve) hours.   ranitidine 150 MG tablet Commonly known as:  ZANTAC Take 1 tablet (150 mg total) by mouth 2 (two) times daily.       Review of systems negative except as  noted in HPI / PMHx or noted below:  Review of Systems  Constitutional: Negative.   HENT: Negative.   Eyes: Negative.   Respiratory: Negative.   Cardiovascular: Negative.   Gastrointestinal: Negative.   Genitourinary: Negative.   Musculoskeletal: Negative.   Skin: Negative.   Neurological: Negative.   Endo/Heme/Allergies: Negative.   Psychiatric/Behavioral: Negative.     Family History  Problem Relation Age of Onset  . Kidney Stones Mother   . Gout Father   . Cancer Father 37       prostate  . Allergic rhinitis Father   . Asthma Father   . Angioedema Neg Hx   . Eczema Neg Hx   . Urticaria Neg Hx     Social History   Socioeconomic History  . Marital status: Single    Spouse name: Not on file  . Number of children: Not on file  . Years of education: Not on file  . Highest education level: Not on file  Social Needs  . Financial resource strain: Not on file  . Food insecurity - worry: Not on file  . Food insecurity - inability: Not on file  . Transportation needs - medical: Not on file  . Transportation needs - non-medical: Not on file  Occupational History  . Not on file  Tobacco Use  . Smoking status: Never Smoker  . Smokeless tobacco: Never Used  Substance and Sexual Activity  . Alcohol use: Yes  . Drug use: Yes    Types: Marijuana  . Sexual activity: Not on file  Other Topics Concern  . Not on file  Social History Narrative  . Not on file    Environmental and Social history  Lives in a house with a dry environment, dogs located inside the household, carpet in the bedroom, plastic on the bed, no plastic on the pillow, actively smoking marijuana at least 3 times a day, and employment as a Visual merchandiser.  Objective:   Vitals:   11/13/17 1453  BP: 114/80  Pulse: 88  Resp: 20  Temp: (!) 97.5 F (36.4 C)   Height: 5' 5.5" (166.4 cm) Weight: 176 lb 9.6 oz (80.1 kg)  Physical Exam  Constitutional: He is well-developed, well-nourished, and in no distress.   HENT:  Head: Normocephalic. Head is without right periorbital erythema and without left periorbital erythema.  Right Ear: Tympanic membrane, external ear and ear canal normal.  Left Ear: Tympanic membrane, external ear and ear canal normal.  Nose: Nose normal. No mucosal edema or rhinorrhea.  Mouth/Throat: Uvula is midline, oropharynx is clear and moist and mucous membranes are normal. No oropharyngeal exudate.  Eyes: Conjunctivae and lids are normal. Pupils are equal, round, and reactive to light.  Neck: Trachea normal. No tracheal tenderness present. No tracheal deviation present. No thyromegaly  present.  Cardiovascular: Normal rate, regular rhythm, S1 normal, S2 normal and normal heart sounds.  No murmur heard. Pulmonary/Chest: Effort normal and breath sounds normal. No stridor. No tachypnea. No respiratory distress. He has no wheezes. He has no rales. He exhibits no tenderness.  Abdominal: Soft. He exhibits no distension and no mass. There is no hepatosplenomegaly. There is no tenderness. There is no rebound and no guarding.  Musculoskeletal: He exhibits no edema or tenderness.  Lymphadenopathy:       Head (right side): No tonsillar adenopathy present.       Head (left side): No tonsillar adenopathy present.    He has no cervical adenopathy.    He has no axillary adenopathy.  Neurological: He is alert. Gait normal.  Skin: No rash noted. He is not diaphoretic. No erythema. No pallor. Nails show no clubbing.  Psychiatric: Mood and affect normal.    Diagnostics: Allergy skin tests were performed.  He demonstrated severe hypersensitivity against grasses, weeds, trees, molds, house dust mite, and dog.  Spirometry was performed and demonstrated an FEV1 of 3.71 @ 94 % of predicted. FEV1/FVC = 0.89  Assessment and Plan:    1. Asthma, well controlled, mild persistent   2. Other allergic rhinitis   3. LPRD (laryngopharyngeal reflux disease)   4. Exposure to marijuana smoke   5. Migraine  syndrome     1. Allergen avoidance measures  2. Eliminate all smoke exposure  3. Slow taper off all caffeine exposure  4. Treat and prevent inflammation:   A. OTC Nasacort or Rhinocort - one spray each nostril one time per day  B. Asmanex 220 twisthaler - one inhalation one time per day  5. Treat and prevent reflux:   A. Dexilant 60 mg one tablet in AM  B. Ranitidine 300 mg in PM  6. If needed:   A. Proair HFA 2 inhalations every 4-6 hours  B. Cetirizine 10 mg one tablet one time per day  7. Return to clinic in 4 weeks or earlier if problem   [name] has atopic disease giving rise to respiratory tract inflammation and also appears to have a component of LPR and has an airway insult in the form of marijuana smoke exposure and he drinks a very large amount of caffeine which is probably contributing to his anxiety and his reflux and his headaches.  I have made a plan as noted above to address these issues with a combination of allergen avoidance measures, eliminating smoke exposure, tapering off caffeine, using anti-inflammatory agents for his respiratory tract, and treating his reflux.  I will regroup with him in 4 weeks or earlier if there is a problem.  He may be a candidate for immunotherapy if he fails medical treatment.  Jessica PriestEric J. Elvenia Godden, MD Allergy / Immunology [facility] [place]

## 2017-11-13 NOTE — Patient Instructions (Addendum)
  1. Allergen avoidance measures  2. Eliminate all smoke exposure  3. Slow taper off all caffeine exposure  4. Treat and prevent inflammation:   A. OTC Nasacort or Rhinocort - one spray each nostril one time per day  B. Asmanex 220 twisthaler - one inhalation one time per day  5. Treat and prevent reflux:   A. Dexilant 60 mg one tablet in AM  B. Ranitidine 300 mg in PM  6. If needed:   A. Proair HFA 2 inhalations every 4-6 hours  B. Cetirizine 10 mg one tablet one time per day  7. Return to clinic in 4 weeks or earlier if problem

## 2017-11-14 ENCOUNTER — Encounter: Payer: Self-pay | Admitting: Allergy and Immunology

## 2017-11-20 ENCOUNTER — Ambulatory Visit: Payer: BLUE CROSS/BLUE SHIELD | Admitting: Diagnostic Neuroimaging

## 2017-11-20 ENCOUNTER — Encounter: Payer: Self-pay | Admitting: Diagnostic Neuroimaging

## 2017-11-20 VITALS — BP 130/78 | HR 84 | Ht 67.0 in | Wt 175.0 lb

## 2017-11-20 DIAGNOSIS — M542 Cervicalgia: Secondary | ICD-10-CM

## 2017-11-20 DIAGNOSIS — F419 Anxiety disorder, unspecified: Secondary | ICD-10-CM

## 2017-11-20 DIAGNOSIS — F32A Depression, unspecified: Secondary | ICD-10-CM

## 2017-11-20 DIAGNOSIS — F329 Major depressive disorder, single episode, unspecified: Secondary | ICD-10-CM | POA: Diagnosis not present

## 2017-11-20 DIAGNOSIS — G8929 Other chronic pain: Secondary | ICD-10-CM | POA: Diagnosis not present
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
## 2017-11-20 NOTE — Patient Instructions (Signed)
CHRONIC NECK PAIN - consider PT / OT vs yoga / stretching exercises - consider gabapentin or cymbalta  DEPRESSION / ANXIETY  - follow up with PCP; consider psychiatry / psychology evaluation  HEADACHES (some migraine features) - monitor for now; improved with new eyeglasses

## 2017-12-11 ENCOUNTER — Encounter: Payer: Self-pay | Admitting: Allergy and Immunology

## 2017-12-11 ENCOUNTER — Ambulatory Visit: Payer: BLUE CROSS/BLUE SHIELD | Admitting: Allergy and Immunology

## 2017-12-11 VITALS — BP 108/78 | HR 68 | Resp 16

## 2017-12-11 DIAGNOSIS — J3089 Other allergic rhinitis: Secondary | ICD-10-CM | POA: Diagnosis not present

## 2017-12-11 DIAGNOSIS — J4530 Mild persistent asthma, uncomplicated: Secondary | ICD-10-CM

## 2017-12-11 DIAGNOSIS — K219 Gastro-esophageal reflux disease without esophagitis: Secondary | ICD-10-CM | POA: Diagnosis not present

## 2017-12-11 DIAGNOSIS — G43909 Migraine, unspecified, not intractable, without status migrainosus: Secondary | ICD-10-CM | POA: Diagnosis not present

## 2017-12-11 DIAGNOSIS — Z7729 Contact with and (suspected ) exposure to other hazardous substances: Secondary | ICD-10-CM | POA: Diagnosis not present
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
## 2017-12-11 NOTE — Patient Instructions (Addendum)
  1. Continue to perform Allergen avoidance measures  2. Continue to minimize smoke exposure  3. Continue off all caffeine exposure  4. Continue to Treat and prevent inflammation:   A. OTC Nasacort or Rhinocort - one spray each nostril one time per day  5. Continue to Treat and prevent reflux:   A. Dexilant 60 mg one tablet in AM  B. Ranitidine 300 mg in PM  6. If needed:   A. Proair HFA 2 inhalations every 4-6 hours  B. Cetirizine 10 mg one tablet one time per day  7. May need to start Asmanex 220 - one inhalation one time per day during Spring.   8. Immunotherapy?  9. Return to clinic Summer 2019 or earlier if problem

## 2017-12-12 ENCOUNTER — Encounter: Payer: Self-pay | Admitting: Allergy and Immunology

## 2017-12-26 ENCOUNTER — Encounter: Payer: Self-pay | Admitting: Internal Medicine

## 2017-12-26 ENCOUNTER — Ambulatory Visit: Payer: BLUE CROSS/BLUE SHIELD | Admitting: Internal Medicine

## 2017-12-26 ENCOUNTER — Encounter (INDEPENDENT_AMBULATORY_CARE_PROVIDER_SITE_OTHER): Payer: Self-pay

## 2017-12-26 VITALS — BP 122/90 | HR 72 | Ht 67.0 in | Wt 186.0 lb

## 2017-12-26 DIAGNOSIS — F458 Other somatoform disorders: Secondary | ICD-10-CM | POA: Diagnosis not present

## 2017-12-26 DIAGNOSIS — R198 Other specified symptoms and signs involving the digestive system and abdomen: Secondary | ICD-10-CM

## 2017-12-26 DIAGNOSIS — F419 Anxiety disorder, unspecified: Secondary | ICD-10-CM | POA: Diagnosis not present

## 2017-12-26 DIAGNOSIS — K219 Gastro-esophageal reflux disease without esophagitis: Secondary | ICD-10-CM

## 2017-12-26 DIAGNOSIS — R0989 Other specified symptoms and signs involving the circulatory and respiratory systems: Secondary | ICD-10-CM

## 2017-12-26 MED ORDER — SUCRALFATE 1 GM/10ML PO SUSP: 1.0000 g | Freq: Four times a day (QID) | ORAL | 1 refills | Status: DC | PRN

## 2017-12-26 NOTE — Patient Instructions (Addendum)
[facility]. If you have not heard from them within 1 week, please call our office at [phone] or contact them directly at [phone].  You have been scheduled for an endoscopy. Please follow written instructions given to you at your visit today. If you use inhalers (even only as needed), please bring them with you on the day of your procedure. Your physician has requested that you go to www.startemmi.com and enter the access code given to you at your visit today. This web site gives a general overview about your procedure. However, you should still follow specific instructions given to you by our office regarding your preparation for the procedure.  Continue taking your Dexilant 60mg  daily. Continue taking your Zantac 300mg  daily at bedtime.  If you are age 32 or older, your body mass index should be between 23-30. Your Body mass index is 29.13 kg/m. If this is out of the aforementioned range listed, please consider follow up with your Primary Care Provider.  If you are age 32 or younger, your body mass index should be between 19-25. Your Body mass index is 29.13 kg/m. If this is out of the aformentioned range listed, please consider follow up with your Primary Care Provider.   Thank you for choosing [facility], Dr. Rhea BeltonPyrtle

## 2017-12-27 ENCOUNTER — Encounter: Payer: Self-pay | Admitting: Internal Medicine
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
## 2017-12-28 ENCOUNTER — Telehealth: Payer: Self-pay | Admitting: *Deleted

## 2017-12-28 MED ORDER — SUCRALFATE 1 G PO TABS: ORAL_TABLET | ORAL | 1 refills | Status: DC

## 2017-12-28 NOTE — Telephone Encounter (Signed)
Patient's insurance will not cover carafate liquid. We will send carafate tablets instead and have pharmacy instruct patient to make into slurry.

## 2018-01-01 ENCOUNTER — Other Ambulatory Visit: Payer: Self-pay

## 2018-01-01 ENCOUNTER — Encounter: Payer: Self-pay | Admitting: Internal Medicine

## 2018-01-01 ENCOUNTER — Ambulatory Visit (AMBULATORY_SURGERY_CENTER): Payer: BLUE CROSS/BLUE SHIELD | Admitting: Internal Medicine

## 2018-01-01 VITALS — BP 116/73 | HR 68 | Temp 98.6°F | Resp 9 | Ht 67.0 in | Wt 186.0 lb

## 2018-01-01 DIAGNOSIS — K219 Gastro-esophageal reflux disease without esophagitis: Secondary | ICD-10-CM

## 2018-01-01 MED ORDER — SODIUM CHLORIDE 0.9 % IV SOLN: 500.0000 mL | Freq: Once | INTRAVENOUS | Status: DC

## 2018-01-01 NOTE — Patient Instructions (Signed)
HANDOUT GIVEN FOR GERD/HIATAL HERNIA  YOU HAD AN ENDOSCOPIC PROCEDURE TODAY AT THE [facility]:   Refer to the procedure report that was given to you for any specific questions about what was found during the examination.  If the procedure report does not answer your questions, please call your gastroenterologist to clarify.  If you requested that your care partner not be given the details of your procedure findings, then the procedure report has been included in a sealed envelope for you to review at your convenience later.  YOU SHOULD EXPECT: Some feelings of bloating in the abdomen. Passage of more gas than usual.  Walking can help get rid of the air that was put into your GI tract during the procedure and reduce the bloating. If you had a lower endoscopy (such as a colonoscopy or flexible sigmoidoscopy) you may notice spotting of blood in your stool or on the toilet paper. If you underwent a bowel prep for your procedure, you may not have a normal bowel movement for a few days.  Please Note:  You might notice some irritation and congestion in your nose or some drainage.  This is from the oxygen used during your procedure.  There is no need for concern and it should clear up in a day or so.  SYMPTOMS TO REPORT IMMEDIATELY:  Following upper endoscopy (EGD)  Vomiting of blood or coffee ground material  New chest pain or pain under the shoulder blades  Painful or persistently difficult swallowing  New shortness of breath  Fever of 100F or higher  Black, tarry-looking stools  For urgent or emergent issues, a gastroenterologist can be reached at any hour by calling [phone].   DIET:  We do recommend a small meal at first, but then you may proceed to your regular diet.  Drink plenty of fluids but you should avoid alcoholic beverages for 24 hours.  ACTIVITY:  You should plan to take it easy for the rest of today and you should NOT DRIVE or use heavy machinery until tomorrow  (because of the sedation medicines used during the test).    FOLLOW UP: Our staff will call the number listed on your records the next business day following your procedure to check on you and address any questions or concerns that you may have regarding the information given to you following your procedure. If we do not reach you, we will leave a message.  However, if you are feeling well and you are not experiencing any problems, there is no need to return our call.  We will assume that you have returned to your regular daily activities without incident.  If any biopsies were taken you will be contacted by phone or by letter within the next 1-3 weeks.  Please call us at [phone] if you have not heard about the biopsies in 3 weeks.    SIGNATURES/CONFIDENTIALITY: You and/or your care partner have signed paperwork which will be entered into your electronic medical record.  These signatures attest to the fact that that the information above on your After Visit Summary has been reviewed and is understood.  Full responsibility of the confidentiality of this discharge information lies with you and/or your care-partner.

## 2018-01-01 NOTE — Progress Notes (Signed)
Pt's states no medical or surgical changes since previsit or office visit. 

## 2018-01-01 NOTE — Op Note (Signed)
[facility] Patient Name: Roy Scott Procedure Date: 01/01/2018 9:09 AM MRN: [id number] Endoscopist: Beverley FiedlerJay M Melenie Minniear , MD Age: 32 Referring MD:  Date of Birth: [birth year] Gender: Male Account #: [id number] Procedure:                Upper GI endoscopy Indications:              Gastro-esophageal reflux disease, Globus sensation Medicines:                Monitored Anesthesia Care Procedure:                Pre-Anesthesia Assessment:                           - Prior to the procedure, a History and Physical                            was performed, and patient medications and                            allergies were reviewed. The patient's tolerance of                            previous anesthesia was also reviewed. The risks                            and benefits of the procedure and the sedation                            options and risks were discussed with the patient.                            All questions were answered, and informed consent                            was obtained. Prior Anticoagulants: The patient has                            taken no previous anticoagulant or antiplatelet                            agents. ASA Grade Assessment: II - A patient with                            mild systemic disease. After reviewing the risks                            and benefits, the patient was deemed in                            satisfactory condition to undergo the procedure.                           After obtaining informed consent, the endoscope was  passed under direct vision. Throughout the                            procedure, the patient's blood pressure, pulse, and                            oxygen saturations were monitored continuously. The                            Endoscope was introduced through the mouth, and                            advanced to the second part of duodenum. The upper                            GI endoscopy  was accomplished without difficulty.                            The patient tolerated the procedure well. Scope In: Scope Out: Findings:                 Normal mucosa was found in the entire esophagus.                           A 1 cm hiatal hernia was present.                           The Z-line was regular and was found 38 cm from the                            incisors. There is a partial, widely patent                            Schatzki's ring at the GE junction.                           The entire examined stomach was normal.                           The examined duodenum was normal. Complications:            No immediate complications. Estimated Blood Loss:     Estimated blood loss: none. Impression:               - Normal mucosa was found in the entire esophagus.                           - 1 cm hiatal hernia.                           - Normal stomach.                           - Normal examined duodenum.                           -  No specimens collected. Recommendation:           - Patient has a contact number available for                            emergencies. The signs and symptoms of potential                            delayed complications were discussed with the                            patient. Return to normal activities tomorrow.                            Written discharge instructions were provided to the                            patient.                           - Resume previous diet.                           - Continue present medications.                           - Proceed with behavioral health consultation and                            management as anxiety disorder is exacerbating                            reflux symptoms. Beverley Fiedler, MD 01/01/2018 9:25:49 AM This report has been signed electronically.

## 2018-01-01 NOTE — Progress Notes (Signed)
Report to PACU, RN, vss, BBS= Clear.  

## 2018-01-02 ENCOUNTER — Telehealth: Payer: Self-pay

## 2018-01-02 NOTE — Telephone Encounter (Signed)
  Follow up Call-  Call back number 01/01/2018  Post procedure Call Back phone  # [phone] or mother - [phone]  Permission to leave phone message Yes  Some recent data might be hidden     Patient questions:  Do you have a fever, pain , or abdominal swelling? No. Pain Score  0 *  Have you tolerated food without any problems? Yes.    Have you been able to return to your normal activities? Yes.    Do you have any questions about your discharge instructions: Diet   No. Medications  No. Follow up visit  No.  Do you have questions or concerns about your Care? No.  Actions: * If pain score is 4 or above: No action needed, pain <4.

## 2018-01-12 ENCOUNTER — Other Ambulatory Visit: Payer: Self-pay | Admitting: Physician Assistant

## 2018-01-12 DIAGNOSIS — F329 Major depressive disorder, single episode, unspecified: Secondary | ICD-10-CM

## 2018-01-12 DIAGNOSIS — F32A Depression, unspecified: Secondary | ICD-10-CM

## 2018-01-14 NOTE — Telephone Encounter (Signed)
Last seen 09/21/17   

## 2018-01-31 ENCOUNTER — Ambulatory Visit: Payer: BLUE CROSS/BLUE SHIELD | Admitting: Clinical

## 2018-01-31 DIAGNOSIS — F419 Anxiety disorder, unspecified: Secondary | ICD-10-CM | POA: Diagnosis not present
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
## 2018-02-27 ENCOUNTER — Ambulatory Visit: Payer: BLUE CROSS/BLUE SHIELD | Admitting: Clinical

## 2018-02-27 DIAGNOSIS — F419 Anxiety disorder, unspecified: Secondary | ICD-10-CM

## 2018-03-07 DIAGNOSIS — M9904 Segmental and somatic dysfunction of sacral region: Secondary | ICD-10-CM | POA: Diagnosis not present

## 2018-03-07 DIAGNOSIS — M9903 Segmental and somatic dysfunction of lumbar region: Secondary | ICD-10-CM | POA: Diagnosis not present

## 2018-03-07 DIAGNOSIS — M9902 Segmental and somatic dysfunction of thoracic region: Secondary | ICD-10-CM | POA: Diagnosis not present

## 2018-03-07 DIAGNOSIS — M9901 Segmental and somatic dysfunction of cervical region: Secondary | ICD-10-CM | POA: Diagnosis not present

## 2018-04-04 DIAGNOSIS — M9902 Segmental and somatic dysfunction of thoracic region: Secondary | ICD-10-CM | POA: Diagnosis not present

## 2018-04-04 DIAGNOSIS — M9903 Segmental and somatic dysfunction of lumbar region: Secondary | ICD-10-CM | POA: Diagnosis not present

## 2018-04-04 DIAGNOSIS — M9901 Segmental and somatic dysfunction of cervical region: Secondary | ICD-10-CM | POA: Diagnosis not present

## 2018-04-04 DIAGNOSIS — M9904 Segmental and somatic dysfunction of sacral region: Secondary | ICD-10-CM | POA: Diagnosis not present

## 2018-04-09 ENCOUNTER — Other Ambulatory Visit: Payer: Self-pay | Admitting: Allergy and Immunology

## 2018-04-18 ENCOUNTER — Other Ambulatory Visit: Payer: Self-pay | Admitting: Family Medicine

## 2018-04-18 DIAGNOSIS — S161XXA Strain of muscle, fascia and tendon at neck level, initial encounter: Secondary | ICD-10-CM

## 2018-04-18 NOTE — Telephone Encounter (Signed)
Last seen 09/21/17   

## 2018-06-24 ENCOUNTER — Other Ambulatory Visit: Payer: Self-pay | Admitting: Physician Assistant

## 2018-06-24 DIAGNOSIS — F32A Depression, unspecified: Secondary | ICD-10-CM

## 2018-06-24 DIAGNOSIS — F329 Major depressive disorder, single episode, unspecified: Secondary | ICD-10-CM

## 2018-06-24 DIAGNOSIS — J309 Allergic rhinitis, unspecified: Secondary | ICD-10-CM

## 2018-06-25 NOTE — Telephone Encounter (Signed)
Last seen 09/21/18

## 2018-07-14 ENCOUNTER — Other Ambulatory Visit: Payer: Self-pay | Admitting: Allergy and Immunology

## 2018-08-04 ENCOUNTER — Other Ambulatory Visit: Payer: Self-pay | Admitting: Allergy and Immunology

## 2018-09-24 ENCOUNTER — Other Ambulatory Visit: Payer: Self-pay | Admitting: Physician Assistant

## 2018-09-24 DIAGNOSIS — J309 Allergic rhinitis, unspecified: Secondary | ICD-10-CM

## 2018-12-12 ENCOUNTER — Other Ambulatory Visit: Payer: Self-pay | Admitting: *Deleted

## 2018-12-12 MED ORDER — ALBUTEROL SULFATE HFA 108 (90 BASE) MCG/ACT IN AERS: INHALATION_SPRAY | RESPIRATORY_TRACT | 0 refills | Status: DC

## 2018-12-12 NOTE — Telephone Encounter (Signed)
Patient has not been seen since 09/21/2017

## 2018-12-12 NOTE — Telephone Encounter (Signed)
What is the question? 

## 2019-10-01 ENCOUNTER — Encounter: Payer: Self-pay | Admitting: Physician Assistant

## 2019-10-01 ENCOUNTER — Ambulatory Visit (INDEPENDENT_AMBULATORY_CARE_PROVIDER_SITE_OTHER): Payer: BLUE CROSS/BLUE SHIELD | Admitting: Physician Assistant

## 2019-10-01 DIAGNOSIS — J4520 Mild intermittent asthma, uncomplicated: Secondary | ICD-10-CM

## 2019-10-01 DIAGNOSIS — K219 Gastro-esophageal reflux disease without esophagitis: Secondary | ICD-10-CM

## 2019-10-01 DIAGNOSIS — F419 Anxiety disorder, unspecified: Secondary | ICD-10-CM

## 2019-10-01 DIAGNOSIS — F32A Depression, unspecified: Secondary | ICD-10-CM

## 2019-10-01 DIAGNOSIS — J45909 Unspecified asthma, uncomplicated: Secondary | ICD-10-CM | POA: Insufficient documentation

## 2019-10-01 DIAGNOSIS — F329 Major depressive disorder, single episode, unspecified: Secondary | ICD-10-CM

## 2019-10-01 MED ORDER — PANTOPRAZOLE SODIUM 40 MG PO TBEC: 40.0000 mg | DELAYED_RELEASE_TABLET | Freq: Every day | ORAL | 3 refills | Status: DC

## 2019-10-01 MED ORDER — ALPRAZOLAM 0.5 MG PO TABS: ORAL_TABLET | ORAL | 0 refills | Status: DC

## 2019-10-01 MED ORDER — ALBUTEROL SULFATE HFA 108 (90 BASE) MCG/ACT IN AERS: INHALATION_SPRAY | RESPIRATORY_TRACT | 1 refills | Status: DC

## 2019-10-01 NOTE — Progress Notes (Signed)
Telephone visit  Subjective: RX:VQMG, anxiety, asthma PCP: Terald Sleeper, PA-C QQP:[id number] C Aversa is a 34 y.o. male calls for telephone consult today. Patient provides verbal consent for consult held via phone.  Patient is identified with 2 separate identifiers.  At this time the entire area is on COVID-19 social distancing and stay home orders are in place.  Patient is of higher risk and therefore we are performing this by a virtual method.  Location of patient: home Location of provider: [facility] Others present for call: no    Patient is having a visit concerning his long-term abdominal pain and reflux.  He has known disease and has seen gastroenterology in the past.  He does not feel like his Zantac and been doing well anymore.  It is been quite sometime since he had taken sucralfate.  He has never taken pantoprazole.  This might be one that is affordable through a prescription card benefit.  He had also been taking some ibuprofen with encouraged to not take any NSAIDs whatsoever because of his GERD.  Also related to this his asthma has been a little bit worse.  And I explained to him that the GERD can come up and irritate the lungs.  He is not long-term issues with allergies and asthma.  He also has had long-term issues with anxiety.  And when he has this abdominal pain it makes things worse.   ROS: Per HPI  Allergies  Allergen Reactions  . Ceclor [Cefaclor]   . Penicillins   . Sulfa Antibiotics   . Latex Rash   Past Medical History:  Diagnosis Date  . Allergy   . Anxiety   . Asthma   . GERD (gastroesophageal reflux disease)   . Laryngopharyngeal reflux (LPR)     Current Outpatient Medications:  .  albuterol (PROAIR HFA) 108 (90 Base) MCG/ACT inhaler, INHALE 1 PUFF INTO THE LUNGS EVERY 4 (FOUR) HOURS AS NEEDED FOR WHEEZING OR SHORTNESS OF BREATH., Disp: 6.7 g, Rfl: 1 .  ALPRAZolam (XANAX) 0.5 MG tablet, TAKE 1 TABLET BY MOUTH TWICE A DAY AS NEEDED FOR ANXIETY, Disp:  30 tablet, Rfl: 0 .  cetirizine (ZYRTEC) 10 MG tablet, TAKE 1 TABLET BY MOUTH EVERY DAY, Disp: 90 tablet, Rfl: 0 .  pantoprazole (PROTONIX) 40 MG tablet, Take 1 tablet (40 mg total) by mouth daily., Disp: 30 tablet, Rfl: 3  Assessment/ Plan: 34 y.o. male   1. Depression, unspecified depression type  2. Intermittent asthma without complication, unspecified asthma severity - albuterol (PROAIR HFA) 108 (90 Base) MCG/ACT inhaler; INHALE 1 PUFF INTO THE LUNGS EVERY 4 (FOUR) HOURS AS NEEDED FOR WHEEZING OR SHORTNESS OF BREATH.  Dispense: 6.7 g; Refill: 1  3. Anxiety - ALPRAZolam (XANAX) 0.5 MG tablet; TAKE 1 TABLET BY MOUTH TWICE A DAY AS NEEDED FOR ANXIETY  Dispense: 30 tablet; Refill: 0  4. Gastroesophageal reflux disease, unspecified whether esophagitis present - pantoprazole (PROTONIX) 40 MG tablet; Take 1 tablet (40 mg total) by mouth daily.  Dispense: 30 tablet; Refill: 3   Return in about 4 weeks (around 10/29/2019).  Continue all other maintenance medications as listed above.  Start time: 12:18 PM End time: 12:40 PM  Meds ordered this encounter  Medications  . pantoprazole (PROTONIX) 40 MG tablet    Sig: Take 1 tablet (40 mg total) by mouth daily.    Dispense:  30 tablet    Refill:  3    Coming with Good Rx coupon  Order Specific Question:   Supervising Provider    Answer:   Raliegh Ip [[id number]]  . ALPRAZolam (XANAX) 0.5 MG tablet    Sig: TAKE 1 TABLET BY MOUTH TWICE A DAY AS NEEDED FOR ANXIETY    Dispense:  30 tablet    Refill:  0    Not to exceed 3 additional fills before 03/12/2018.    Order Specific Question:   Supervising Provider    Answer:   Raliegh Ip [[id number]]  . albuterol (PROAIR HFA) 108 (90 Base) MCG/ACT inhaler    Sig: INHALE 1 PUFF INTO THE LUNGS EVERY 4 (FOUR) HOURS AS NEEDED FOR WHEEZING OR SHORTNESS OF BREATH.    Dispense:  6.7 g    Refill:  1    Order Specific Question:   Supervising Provider    Answer:   Raliegh Ip  [[id number]]    Prudy Feeler PA-C [facility] [phone]

## 2019-10-22 ENCOUNTER — Telehealth: Payer: Self-pay | Admitting: Physician Assistant

## 2019-10-22 ENCOUNTER — Other Ambulatory Visit: Payer: Self-pay | Admitting: Physician Assistant

## 2019-10-22 MED ORDER — AZITHROMYCIN 250 MG PO TABS: ORAL_TABLET | ORAL | 0 refills | Status: DC

## 2019-10-22 NOTE — Telephone Encounter (Signed)
Sent zpak.

## 2019-10-22 NOTE — Telephone Encounter (Signed)
What symptoms do you have? Pt has GERD, little stopped up thinks its sinus infection  How long have you been sick? Since Sunday   Have you been seen for this problem? No, would like antibiotic prescribed by Lawanna Kobus  If your provider decides to give you a prescription, which pharmacy would you like for it to be sent to? [facility] [place]   Patient informed that this information will be sent to the clinical staff for review and that they should receive a follow up call.

## 2019-10-22 NOTE — Telephone Encounter (Signed)
Pt aware.

## 2020-01-20 ENCOUNTER — Ambulatory Visit (INDEPENDENT_AMBULATORY_CARE_PROVIDER_SITE_OTHER): Payer: Self-pay | Admitting: Family Medicine

## 2020-01-20 ENCOUNTER — Encounter: Payer: Self-pay | Admitting: Family Medicine

## 2020-01-20 ENCOUNTER — Other Ambulatory Visit: Payer: Self-pay

## 2020-01-20 VITALS — BP 112/68 | HR 72 | Temp 98.1°F | Ht 67.0 in | Wt 187.2 lb

## 2020-01-20 DIAGNOSIS — J4530 Mild persistent asthma, uncomplicated: Secondary | ICD-10-CM

## 2020-01-20 DIAGNOSIS — F411 Generalized anxiety disorder: Secondary | ICD-10-CM

## 2020-01-20 DIAGNOSIS — Z7689 Persons encountering health services in other specified circumstances: Secondary | ICD-10-CM

## 2020-01-20 DIAGNOSIS — J302 Other seasonal allergic rhinitis: Secondary | ICD-10-CM

## 2020-01-20 DIAGNOSIS — F410 Panic disorder [episodic paroxysmal anxiety] without agoraphobia: Secondary | ICD-10-CM

## 2020-01-20 MED ORDER — FLUTICASONE-SALMETEROL 250-50 MCG/DOSE IN AEPB: 1.0000 | INHALATION_SPRAY | Freq: Two times a day (BID) | RESPIRATORY_TRACT | 12 refills | Status: DC

## 2020-01-20 NOTE — Patient Instructions (Signed)
We discussed the risks of benzodiazepines.  I do not think that this is a good first line therapy for you.  We discussed consideration for alternatives but you declined these.   I have placed a referral to psychiatry to discuss your concerns further.  Raynelle Fanning talked to you today about inhalers.

## 2020-01-20 NOTE — Addendum Note (Signed)
Addended by: Raliegh Ip on: 01/20/2020 03:14 PM   Modules accepted: Orders

## 2020-01-20 NOTE — Progress Notes (Signed)
Subjective: CC: est care, GAD panic, allergy induced bronchospasm PCP: Roy Ip, DO Roy Scott is a 34 y.o. male presenting to clinic today for:  1. GAD w/ panic episodes Patient reports longstanding history of generalized anxiety disorder.  He notes symptoms seem to peak in 2018.  He is had multiple stressors.  He works on a farm.  He identifies his father as something that also causes him some stress.  He was on an unknown SSRI in the past that caused worsening symptoms of depression and anxiety.  He was never trialed on others.  He does not see a counselor or psychiatrist.  He was started on benzodiazepines after a "friend" gave him a half a tablet and it worked well to relieve his symptoms.  He has been using these intermittently as needed panic.  He often will wake up in the morning feeling anxious.  He denies excessive sedation from the medication.  No falls or accidents.  2.  Seasonal allergies with bronchospasm Patient reports that he is prescribed Advair 250 but he only uses as needed due to cost.  He pays out-of-pocket and the cost of the medication is greater than $300.  Symptoms do seem to be seasonal.  He has been needing it more often since the pollen has been dropping.  He reports almost a twice daily use of albuterol.  He often will use this before bedtime and he denies any heart palpitations, tremor or increased anxiety on the medication.  3.  GERD Patient reports good control of symptoms with Protonix.  Does not report any vomiting, hematochezia or melena.   ROS: Per HPI  Allergies  Allergen Reactions  . Ceclor [Cefaclor]   . Penicillins   . Sulfa Antibiotics   . Latex Rash   Past Medical History:  Diagnosis Date  . Allergy   . Anxiety   . Asthma   . GERD (gastroesophageal reflux disease)   . Laryngopharyngeal reflux (LPR)     Current Outpatient Medications:  .  albuterol (PROAIR HFA) 108 (90 Base) MCG/ACT inhaler, INHALE 1 PUFF INTO THE LUNGS  EVERY 4 (FOUR) HOURS AS NEEDED FOR WHEEZING OR SHORTNESS OF BREATH., Disp: 6.7 g, Rfl: 1 .  ALPRAZolam (XANAX) 0.5 MG tablet, TAKE 1 TABLET BY MOUTH TWICE A DAY AS NEEDED FOR ANXIETY, Disp: 30 tablet, Rfl: 0 .  cetirizine (ZYRTEC) 10 MG tablet, TAKE 1 TABLET BY MOUTH EVERY DAY, Disp: 90 tablet, Rfl: 0 .  pantoprazole (PROTONIX) 40 MG tablet, Take 1 tablet (40 mg total) by mouth daily., Disp: 30 tablet, Rfl: 3 Social History   Socioeconomic History  . Marital status: Single    Spouse name: Not on file  . Number of children: Not on file  . Years of education: Not on file  . Highest education level: Not on file  Occupational History  . Not on file  Tobacco Use  . Smoking status: Never Smoker  . Smokeless tobacco: Never Used  Substance and Sexual Activity  . Alcohol use: Yes    Comment: 1/monthly  . Drug use: Yes    Types: Marijuana    Comment: 3 joints daily  . Sexual activity: Not on file  Other Topics Concern  . Not on file  Social History Narrative   Lives alone    Works at [employer] education   No children   Social Determinants of Health   Financial Resource Strain:   . Difficulty of Paying Living Expenses:  Food Insecurity:   . Worried About Programme researcher, broadcasting/film/video in the Last Year:   . Barista in the Last Year:   Transportation Needs:   . Freight forwarder (Medical):   Marland Kitchen Lack of Transportation (Non-Medical):   Physical Activity:   . Days of Exercise per Week:   . Minutes of Exercise per Session:   Stress:   . Feeling of Stress :   Social Connections:   . Frequency of Communication with Friends and Family:   . Frequency of Social Gatherings with Friends and Family:   . Attends Religious Services:   . Active Member of Clubs or Organizations:   . Attends Banker Meetings:   Marland Kitchen Marital Status:   Intimate Partner Violence:   . Fear of Current or Ex-Partner:   . Emotionally Abused:   Marland Kitchen Physically Abused:   . Sexually Abused:     Family History  Problem Relation Age of Onset  . Kidney Stones Mother   . Hypertension Mother   . Gout Father   . Cancer Father 45       prostate  . Allergic rhinitis Father   . Asthma Father   . Prostate cancer Father   . Breast cancer Paternal Aunt   . Angioedema Neg Hx   . Eczema Neg Hx   . Urticaria Neg Hx     Objective: Office vital signs reviewed. BP 112/68   Pulse 72   Temp 98.1 F (36.7 Roy)   Ht 5\' 7"  (1.702 m)   Wt 187 lb 3.2 oz (84.9 kg)   SpO2 97%   BMI 29.32 kg/m   Physical Examination:  General: Awake, alert, well nourished, well appearing male. No acute distress HEENT: Normal, sclera white, MMM Cardio: regular rate and rhythm, S1S2 heard, no murmurs appreciated Pulm: Breath sounds are tight.  No overt wheezing noted.  Normal work of breathing on room air.  No rhonchi or rales. Psych: Somewhat agitated.  Eye contact fair.  Does not appear to be responding to internal stimuli.  Thought process linear.   Depression screen [facility] 2/9 01/20/2020 09/21/2017 09/13/2017  Decreased Interest 2 0 0  Down, Depressed, Hopeless 1 1 1   PHQ - 2 Score 3 1 1   Altered sleeping 3 - -  Tired, decreased energy 1 - -  Change in appetite 1 - -  Feeling bad or failure about yourself  1 - -  Trouble concentrating 1 - -  Moving slowly or fidgety/restless 0 - -  Suicidal thoughts 0 - -  PHQ-9 Score 10 - -  Difficult doing work/chores Somewhat difficult - -   GAD 7 : Generalized Anxiety Score 01/20/2020  Nervous, Anxious, on Edge 2  Control/stop worrying 2  Worry too much - different things 1  Trouble relaxing 1  Restless 0  Easily annoyed or irritable 1  Afraid - awful might happen 0  Total GAD 7 Score 7  Anxiety Difficulty Extremely difficult    Assessment/ Plan: 34 y.o. male   1. Generalized anxiety disorder with panic attacks I offered her multiple alternatives to the benzodiazepine but patient declined these.  I had a very frank conversation with regards to  benzodiazepine, the fact that they are falling out of favor given the increased risks surrounding these medications.  However, patient was very reluctant to try any other medications and became agitated when I explained that  I do not think that this is an appropriate medication to continue for  this patient.  We discussed likelihood of increased need, chemical dependence, risks when operating heavy machinery and increased risk for adverse events.  Referral placed to psychiatry for assistance. - Ambulatory referral to Psychiatry  2. Establishing care with new doctor, encounter for  3. Seasonal allergies Continue Zyrtec.  I had him talk to the pharmacist today to help with more affordable inhalers.  See below  4. Mild persistent extrinsic asthma without complication Patient given information for patient assistance.  We will plan for Wixela 250 twice daily.  May continue albuterol inhaler if needed.  We discussed the cardiovascular implications of overuse of the albuterol.  Ideally he will use the [facility] for control of asthma.  Lung exam was certainly tight today but no evidence of acute respiratory distress or asthma exacerbation.   Orders Placed This Encounter  Procedures  . Ambulatory referral to Psychiatry    Referral Priority:   Routine    Referral Type:   Psychiatric    Referral Reason:   Specialty Services Required    Requested Specialty:   Psychiatry    Number of Visits Requested:   1   No orders of the defined types were placed in this encounter.    Janora Norlander, DO [place] [phone]

## 2020-06-07 ENCOUNTER — Telehealth: Payer: Self-pay | Admitting: Family Medicine

## 2020-06-07 DIAGNOSIS — K219 Gastro-esophageal reflux disease without esophagitis: Secondary | ICD-10-CM

## 2020-06-07 MED ORDER — PANTOPRAZOLE SODIUM 40 MG PO TBEC: 40.0000 mg | DELAYED_RELEASE_TABLET | Freq: Every day | ORAL | 1 refills | Status: DC

## 2020-06-07 NOTE — Telephone Encounter (Signed)
  Prescription Request  06/07/2020  What is the name of the medication or equipment? pantoprazole (PROTONIX) 40 MG tablet    Have you contacted your pharmacy to request a refill? (if applicable) Yes  Which pharmacy would you like this sent to? [facility]    Patient notified that their request is being sent to the clinical staff for review and that they should receive a response within 2 business days.

## 2020-06-07 NOTE — Telephone Encounter (Signed)
Aware refill sent to pharmacy & 6 mos ckup made for Oct

## 2020-07-20 ENCOUNTER — Other Ambulatory Visit: Payer: Self-pay

## 2020-07-20 ENCOUNTER — Ambulatory Visit (INDEPENDENT_AMBULATORY_CARE_PROVIDER_SITE_OTHER): Payer: Self-pay | Admitting: Family Medicine

## 2020-07-20 ENCOUNTER — Encounter: Payer: Self-pay | Admitting: Family Medicine

## 2020-07-20 VITALS — BP 130/78 | HR 74 | Temp 98.1°F | Ht 67.0 in | Wt 187.4 lb

## 2020-07-20 DIAGNOSIS — F411 Generalized anxiety disorder: Secondary | ICD-10-CM

## 2020-07-20 DIAGNOSIS — R1012 Left upper quadrant pain: Secondary | ICD-10-CM

## 2020-07-20 DIAGNOSIS — M9908 Segmental and somatic dysfunction of rib cage: Secondary | ICD-10-CM

## 2020-07-20 DIAGNOSIS — K2100 Gastro-esophageal reflux disease with esophagitis, without bleeding: Secondary | ICD-10-CM

## 2020-07-20 DIAGNOSIS — F410 Panic disorder [episodic paroxysmal anxiety] without agoraphobia: Secondary | ICD-10-CM

## 2020-07-20 DIAGNOSIS — R140 Abdominal distension (gaseous): Secondary | ICD-10-CM

## 2020-07-20 LAB — CMP14+EGFR
ALT: 17 IU/L (ref 0–44)
AST: 15 IU/L (ref 0–40)
Albumin/Globulin Ratio: 1.9 (ref 1.2–2.2)
Albumin: 4.8 g/dL (ref 4.0–5.0)
Alkaline Phosphatase: 66 IU/L (ref 44–121)
BUN/Creatinine Ratio: 8 — ABNORMAL LOW (ref 9–20)
BUN: 9 mg/dL (ref 6–20)
Bilirubin Total: 0.7 mg/dL (ref 0.0–1.2)
CO2: 23 mmol/L (ref 20–29)
Calcium: 9.4 mg/dL (ref 8.7–10.2)
Chloride: 104 mmol/L (ref 96–106)
Creatinine, Ser: 1.18 mg/dL (ref 0.76–1.27)
GFR calc Af Amer: 93 mL/min/{1.73_m2} (ref >59)
GFR calc non Af Amer: 80 mL/min/{1.73_m2} (ref >59)
Globulin, Total: 2.5 g/dL (ref 1.5–4.5)
Glucose: 108 mg/dL — ABNORMAL HIGH (ref 65–99)
Potassium: 4 mmol/L (ref 3.5–5.2)
Sodium: 143 mmol/L (ref 134–144)
Total Protein: 7.3 g/dL (ref 6.0–8.5)

## 2020-07-20 LAB — CBC
Hematocrit: 45.7 % (ref 37.5–51.0)
Hemoglobin: 15.8 g/dL (ref 13.0–17.7)
MCH: 31 pg (ref 26.6–33.0)
MCHC: 34.6 g/dL (ref 31.5–35.7)
MCV: 90 fL (ref 79–97)
Platelets: 294 10*3/uL (ref 150–450)
RBC: 5.1 x10E6/uL (ref 4.14–5.80)
RDW: 12.1 % (ref 11.6–15.4)
WBC: 8.2 10*3/uL (ref 3.4–10.8)

## 2020-07-20 MED ORDER — PANTOPRAZOLE SODIUM 40 MG PO TBEC: 40.0000 mg | DELAYED_RELEASE_TABLET | Freq: Two times a day (BID) | ORAL | 5 refills | Status: DC

## 2020-07-20 MED ORDER — DICLOFENAC SODIUM 75 MG PO TBEC: 75.0000 mg | DELAYED_RELEASE_TABLET | Freq: Two times a day (BID) | ORAL | 1 refills | Status: DC | PRN

## 2020-07-20 NOTE — Patient Instructions (Signed)

## 2020-07-20 NOTE — Progress Notes (Signed)
Subjective: CC: Left upper abdominal pain/rib pain PCP: Janora Norlander, DO Roy Scott is a 34 y.o. male presenting to clinic today for:  1.  Left upper abdominal pain/rib pain Patient reports he has had a longstanding history of left-sided rib pain.  Over the last month or so the symptoms seem to be getting worse and he has an abdominal fullness and bloating in the left upper quadrant.  Symptoms seem to be worse with certain positions in bed.  He does admit that he eats quite a bit of spicy food but otherwise tends to try and avoid things that would exacerbate acid reflux.  He is compliant with Protonix.  He does report nausea without vomiting.  He has had no early satiety or decreased p.o. intake.  No diarrhea, constipation, hematochezia, melena, unplanned weight loss.  He has had intermittent night sweats that have been ongoing for a while now.  He denies any preceding injury to the ribs but notes a fall about 4 weeks ago where he fell on an outstretched hand, the left, backwards.  He had some forearm and wrist pain at that time but that has since resolved.  He is currently using Tylenol for the discomfort.  He has been avoiding NSAIDs as he did not want exacerbate his acid reflux.  He does feel that anxiety seems to exacerbate the abdominal discomfort.  No family history of lymphomas, leukemias or gastric cancers.  ROS: Per HPI  Allergies  Allergen Reactions  . Ceclor [Cefaclor]   . Penicillins   . Sulfa Antibiotics   . Latex Rash   Past Medical History:  Diagnosis Date  . Allergy   . Anxiety   . Asthma   . GERD (gastroesophageal reflux disease)   . Laryngopharyngeal reflux (LPR)     Current Outpatient Medications:  .  albuterol (PROAIR HFA) 108 (90 Base) MCG/ACT inhaler, INHALE 1 PUFF INTO THE LUNGS EVERY 4 (FOUR) HOURS AS NEEDED FOR WHEEZING OR SHORTNESS OF BREATH., Disp: 6.7 g, Rfl: 1 .  ALPRAZolam (XANAX) 0.5 MG tablet, TAKE 1 TABLET BY MOUTH TWICE A DAY AS  NEEDED FOR ANXIETY, Disp: 30 tablet, Rfl: 0 .  cetirizine (ZYRTEC) 10 MG tablet, TAKE 1 TABLET BY MOUTH EVERY DAY, Disp: 90 tablet, Rfl: 0 .  Fluticasone-Salmeterol (WIXELA INHUB) 250-50 MCG/DOSE AEPB, Inhale 1 puff into the lungs in the morning and at bedtime., Disp: 60 each, Rfl: 12 .  pantoprazole (PROTONIX) 40 MG tablet, Take 1 tablet (40 mg total) by mouth 2 (two) times daily before a meal., Disp: 60 tablet, Rfl: 5 .  diclofenac (VOLTAREN) 75 MG EC tablet, Take 1 tablet (75 mg total) by mouth 2 (two) times daily as needed (rib pain (take with food!))., Disp: 60 tablet, Rfl: 1 Social History   Socioeconomic History  . Marital status: Single    Spouse name: Not on file  . Number of children: Not on file  . Years of education: Not on file  . Highest education level: Not on file  Occupational History  . Not on file  Tobacco Use  . Smoking status: Never Smoker  . Smokeless tobacco: Never Used  Vaping Use  . Vaping Use: Never used  Substance and Sexual Activity  . Alcohol use: Yes    Comment: 1/monthly  . Drug use: Yes    Types: Marijuana    Comment: 3 joints daily  . Sexual activity: Not on file  Other Topics Concern  . Not on file  Social  History Narrative   Lives alone    Works at [employer] education   No children   Social Determinants of Health   Financial Resource Strain:   . Difficulty of Paying Living Expenses: Not on file  Food Insecurity:   . Worried About Charity fundraiser in the Last Year: Not on file  . Ran Out of Food in the Last Year: Not on file  Transportation Needs:   . Lack of Transportation (Medical): Not on file  . Lack of Transportation (Non-Medical): Not on file  Physical Activity:   . Days of Exercise per Week: Not on file  . Minutes of Exercise per Session: Not on file  Stress:   . Feeling of Stress : Not on file  Social Connections:   . Frequency of Communication with Friends and Family: Not on file  . Frequency of Social  Gatherings with Friends and Family: Not on file  . Attends Religious Services: Not on file  . Active Member of Clubs or Organizations: Not on file  . Attends Archivist Meetings: Not on file  . Marital Status: Not on file  Intimate Partner Violence:   . Fear of Current or Ex-Partner: Not on file  . Emotionally Abused: Not on file  . Physically Abused: Not on file  . Sexually Abused: Not on file   Family History  Problem Relation Age of Onset  . Kidney Stones Mother   . Hypertension Mother   . Gout Father   . Cancer Father 52       prostate  . Allergic rhinitis Father   . Asthma Father   . Prostate cancer Father   . Breast cancer Paternal Aunt   . Angioedema Neg Hx   . Eczema Neg Hx   . Urticaria Neg Hx     Objective: Office vital signs reviewed. BP 130/78   Pulse 74   Temp 98.1 F (36.7 C)   Ht 5' 7"  (1.702 m)   Wt 187 lb 6.4 oz (85 kg)   SpO2 97%   BMI 29.35 kg/m   Physical Examination:  General: Awake, alert, well nourished, No acute distress HEENT: Normal; sclera white.  Moist mucous membranes Cardio: regular rate and rhythm  Pulm:  normal work of breathing on room air GI: soft, mild tenderness noted in the lower abdominal quadrants.  No rebound, guarding. non-distended, bowel sounds present x4, no hepatomegaly, no splenomegaly, no masses MSK: Mild tenderness to palpation at the anterior rib angle on the left at approximately ribs 5-8.  He has prominent rib angles on this side but otherwise no palpable abnormalities  Assessment/ Plan: 34 y.o. male   1. Gastroesophageal reflux disease with esophagitis without hemorrhage Increase Protonix to 40 mg twice daily.  Referral to gastroenterology placed per his request. - pantoprazole (PROTONIX) 40 MG tablet; Take 1 tablet (40 mg total) by mouth 2 (two) times daily before a meal.  Dispense: 60 tablet; Refill: 5 - Ambulatory referral to Gastroenterology  2. Acute LUQ pain Question peptic ulcer disease versus  uncontrolled gastritis.  Protonic was increased to twice daily. - pantoprazole (PROTONIX) 40 MG tablet; Take 1 tablet (40 mg total) by mouth 2 (two) times daily before a meal.  Dispense: 60 tablet; Refill: 5 - Ambulatory referral to Gastroenterology - CMP14+EGFR - CBC  3. Somatic dysfunction of rib Suspect a somatic dysfunction of the rib.  Offered referral for OMT/chiropractor.  Voltaren enteric-coated tablets were prescribed.  Advised to take  with food and to be compliant with PPI as above so as not to exacerbate acid - diclofenac (VOLTAREN) 75 MG EC tablet; Take 1 tablet (75 mg total) by mouth 2 (two) times daily as needed (rib pain (take with food!)).  Dispense: 60 tablet; Refill: 1  4. Abdominal bloating Likely secondary to uncontrolled GERD - Ambulatory referral to Gastroenterology  5. Generalized anxiety disorder with panic attacks Again counseled that benzodiazepines are not appropriate treatment, particularly in the absence of a controller medicine.  I offered again a controller medicine but he declined this today   Orders Placed This Encounter  Procedures  . CMP14+EGFR  . CBC  . Ambulatory referral to Gastroenterology    Referral Priority:   Routine    Referral Type:   Consultation    Referral Reason:   Specialty Services Required    Number of Visits Requested:   1   Meds ordered this encounter  Medications  . pantoprazole (PROTONIX) 40 MG tablet    Sig: Take 1 tablet (40 mg total) by mouth 2 (two) times daily before a meal.    Dispense:  60 tablet    Refill:  5    Coming with Good Rx coupon  . diclofenac (VOLTAREN) 75 MG EC tablet    Sig: Take 1 tablet (75 mg total) by mouth 2 (two) times daily as needed (rib pain (take with food!)).    Dispense:  60 tablet    Refill:  [place], DO [place] [phone]

## 2021-05-13 ENCOUNTER — Encounter: Payer: Self-pay | Admitting: Nurse Practitioner

## 2021-05-13 ENCOUNTER — Ambulatory Visit (INDEPENDENT_AMBULATORY_CARE_PROVIDER_SITE_OTHER): Payer: Self-pay | Admitting: Nurse Practitioner

## 2021-05-13 DIAGNOSIS — R059 Cough, unspecified: Secondary | ICD-10-CM | POA: Insufficient documentation

## 2021-05-13 MED ORDER — ACETAMINOPHEN 500 MG PO TABS: 500.0000 mg | ORAL_TABLET | Freq: Four times a day (QID) | ORAL | 0 refills | Status: AC | PRN

## 2021-05-13 MED ORDER — DM-GUAIFENESIN ER 30-600 MG PO TB12: 1.0000 | ORAL_TABLET | Freq: Two times a day (BID) | ORAL | 0 refills | Status: DC

## 2021-05-13 NOTE — Progress Notes (Signed)
   Virtual Visit  Note Due to COVID-19 pandemic this visit was conducted virtually. This visit type was conducted due to national recommendations for restrictions regarding the COVID-19 Pandemic (e.g. social distancing, sheltering in place) in an effort to limit this patient's exposure and mitigate transmission in our community. All issues noted in this document were discussed and addressed.  A physical exam was not performed with this format.  I connected with Roy Scott on 05/13/21 at 1:25 pm by telephone and verified that I am speaking with the correct person using two identifiers. Roy Scott is currently located at home during visit. The provider, Daryll Drown, NP is located in their office at time of visit.  I discussed the limitations, risks, security and privacy concerns of performing an evaluation and management service by telephone and the availability of in person appointments. I also discussed with the patient that there may be a patient responsible charge related to this service. The patient expressed understanding and agreed to proceed.   History and Present Illness:  Cough This is a new problem. The current episode started yesterday. The problem has been unchanged. The problem occurs every few hours. The cough is Non-productive. Associated symptoms include chills, myalgias and nasal congestion. Pertinent negatives include no chest pain, ear congestion, heartburn, sore throat or shortness of breath. Nothing aggravates the symptoms.     Review of Systems  Constitutional:  Positive for chills.  HENT:  Negative for sore throat.   Respiratory:  Positive for cough. Negative for shortness of breath.   Cardiovascular:  Negative for chest pain.  Gastrointestinal:  Negative for heartburn.  Musculoskeletal:  Positive for myalgias.    Observations/Objective: Televisit patient is not in distress  Assessment and Plan:  Symptoms of chills, body ache, cough, headache, sinus  pressure in the last 24 hours.  No sore throat, nausea or vomiting associated with current symptoms.  Advised patient to complete a PCR COVID-19 test.  Patient is unable to come today due to transportation problems.  Patient is able to get an at home test and will follow-up with results.  Encouraged hydration, Tylenol/ibuprofen for headache and fever, guaifenesin for cough and congestion.  Follow Up Instructions: Follow-up with worsening unresolved symptoms.    I discussed the assessment and treatment plan with the patient. The patient was provided an opportunity to ask questions and all were answered. The patient agreed with the plan and demonstrated an understanding of the instructions.   The patient was advised to call back or seek an in-person evaluation if the symptoms worsen or if the condition fails to improve as anticipated.  The above assessment and management plan was discussed with the patient. The patient verbalized understanding of and has agreed to the management plan. Patient is aware to call the clinic if symptoms persist or worsen. Patient is aware when to return to the clinic for a follow-up visit. Patient educated on when it is appropriate to go to the emergency department.   Time call ended: 1:33 PM  I provided 9 minutes of  non face-to-face time during this encounter.    Daryll Drown, NP

## 2021-05-13 NOTE — Assessment & Plan Note (Signed)
Symptoms of chills, body ache, cough, headache, sinus pressure in the last 24 hours.  No sore throat, nausea or vomiting associated with current symptoms.  Advised patient to complete a PCR COVID-19 test.  Patient is unable to come today due to transportation problems.  Patient is able to get an at home test and will follow-up with results.  Encouraged hydration, Tylenol/ibuprofen for headache and fever, guaifenesin for cough and congestion.

## 2021-07-20 ENCOUNTER — Other Ambulatory Visit: Payer: Self-pay | Admitting: Family Medicine

## 2021-07-20 DIAGNOSIS — R1012 Left upper quadrant pain: Secondary | ICD-10-CM

## 2021-07-20 DIAGNOSIS — K2100 Gastro-esophageal reflux disease with esophagitis, without bleeding: Secondary | ICD-10-CM

## 2021-11-26 ENCOUNTER — Other Ambulatory Visit: Payer: Self-pay | Admitting: Family Medicine

## 2021-11-26 DIAGNOSIS — K2100 Gastro-esophageal reflux disease with esophagitis, without bleeding: Secondary | ICD-10-CM

## 2021-11-26 DIAGNOSIS — R1012 Left upper quadrant pain: Secondary | ICD-10-CM

## 2021-12-12 ENCOUNTER — Encounter: Payer: Self-pay | Admitting: Family Medicine

## 2021-12-12 ENCOUNTER — Ambulatory Visit (INDEPENDENT_AMBULATORY_CARE_PROVIDER_SITE_OTHER): Payer: Self-pay | Admitting: Family Medicine

## 2021-12-12 VITALS — BP 138/81 | HR 83 | Temp 96.9°F | Resp 20 | Ht 67.0 in | Wt 192.0 lb

## 2021-12-12 DIAGNOSIS — R1032 Left lower quadrant pain: Secondary | ICD-10-CM

## 2021-12-12 DIAGNOSIS — J4520 Mild intermittent asthma, uncomplicated: Secondary | ICD-10-CM

## 2021-12-12 DIAGNOSIS — K2100 Gastro-esophageal reflux disease with esophagitis, without bleeding: Secondary | ICD-10-CM

## 2021-12-12 MED ORDER — ALBUTEROL SULFATE HFA 108 (90 BASE) MCG/ACT IN AERS: INHALATION_SPRAY | RESPIRATORY_TRACT | 1 refills | Status: DC

## 2021-12-12 MED ORDER — ALBUTEROL SULFATE HFA 108 (90 BASE) MCG/ACT IN AERS: INHALATION_SPRAY | RESPIRATORY_TRACT | 1 refills | Status: AC

## 2021-12-12 MED ORDER — PANTOPRAZOLE SODIUM 40 MG PO TBEC: DELAYED_RELEASE_TABLET | ORAL | 3 refills | Status: AC

## 2021-12-12 NOTE — Progress Notes (Signed)
? ?Subjective: ?CC:med refills ?PCP: Janora Norlander, DO ?Roy Scott is a 36 y.o. male presenting to clinic today for: ? ?1.  Asthma ?Patient reports that he has been having appropriate exacerbation in his asthma around this time of year but he still only uses his albuterol inhaler a couple times per week.  Has not been needing the Advair and has not had this in quite some time.  Is a non-smoker.  Denying any chest pain, shortness of breath. ? ?2.  Left side pain ?Patient reports left upper quadrant and left lower quadrant abdominal pain that has been ongoing for quite some time now.  He is compliant with his PPI twice daily.  No reports of change in bowel habits, blood in stool, nausea or vomiting.  The pain is more predominant when he tries to lay on that side.  He does have a history of renal stones but does not feel that this feels like his typical renal stone, which he successfully passed before.  No blood in urine, no dysuria or change in urine flow.  No reports of fevers.  He is asking for advanced imaging to further evaluate ? ? ?ROS: Per HPI ? ?Allergies  ?Allergen Reactions  ? Ceclor [Cefaclor]   ? Penicillins   ? Sulfa Antibiotics   ? Latex Rash  ? ?Past Medical History:  ?Diagnosis Date  ? Allergy   ? Anxiety   ? Asthma   ? GERD (gastroesophageal reflux disease)   ? Laryngopharyngeal reflux (LPR)   ? ? ?Current Outpatient Medications:  ?  acetaminophen (TYLENOL) 500 MG tablet, Take 1 tablet (500 mg total) by mouth every 6 (six) hours as needed., Disp: 30 tablet, Rfl: 0 ?  albuterol (PROAIR HFA) 108 (90 Base) MCG/ACT inhaler, INHALE 1 PUFF INTO THE LUNGS EVERY 4 (FOUR) HOURS AS NEEDED FOR WHEEZING OR SHORTNESS OF BREATH., Disp: 6.7 g, Rfl: 1 ?  cetirizine (ZYRTEC) 10 MG tablet, TAKE 1 TABLET BY MOUTH EVERY DAY, Disp: 90 tablet, Rfl: 0 ?  dextromethorphan-guaiFENesin (MUCINEX DM) 30-600 MG 12hr tablet, Take 1 tablet by mouth 2 (two) times daily., Disp: 30 tablet, Rfl: 0 ?  diclofenac (VOLTAREN)  75 MG EC tablet, Take 1 tablet (75 mg total) by mouth 2 (two) times daily as needed (rib pain (take with food!))., Disp: 60 tablet, Rfl: 1 ?  Fluticasone-Salmeterol (WIXELA INHUB) 250-50 MCG/DOSE AEPB, Inhale 1 puff into the lungs in the morning and at bedtime., Disp: 60 each, Rfl: 12 ?  pantoprazole (PROTONIX) 40 MG tablet, TAKE 1 TABLET BY MOUTH TWICE DAILY BEFORE MEAL(S), Disp: 60 tablet, Rfl: 0 ?Social History  ? ?Socioeconomic History  ? Marital status: Single  ?  Spouse name: Not on file  ? Number of children: Not on file  ? Years of education: Not on file  ? Highest education level: Not on file  ?Occupational History  ? Not on file  ?Tobacco Use  ? Smoking status: Never  ? Smokeless tobacco: Never  ?Vaping Use  ? Vaping Use: Never used  ?Substance and Sexual Activity  ? Alcohol use: Yes  ?  Comment: 1/monthly  ? Drug use: Yes  ?  Types: Marijuana  ?  Comment: 3 joints daily  ? Sexual activity: Not on file  ?Other Topics Concern  ? Not on file  ?Social History Narrative  ? Lives alone   ? Works at [employer]  ? College education  ? No children  ? ?Social Determinants of Health  ? ?  Financial Resource Strain: Not on file  ?Food Insecurity: Not on file  ?Transportation Needs: Not on file  ?Physical Activity: Not on file  ?Stress: Not on file  ?Social Connections: Not on file  ?Intimate Partner Violence: Not on file  ? ?Family History  ?Problem Relation Age of Onset  ? Kidney Stones Mother   ? Hypertension Mother   ? Gout Father   ? Cancer Father 74  ?     prostate  ? Allergic rhinitis Father   ? Asthma Father   ? Prostate cancer Father   ? Breast cancer Paternal Aunt   ? Angioedema Neg Hx   ? Eczema Neg Hx   ? Urticaria Neg Hx   ? ? ?Objective: ?Office vital signs reviewed. ?BP 138/81   Pulse 83   Temp (!) 96.9 ?F (36.1 ?C)   Resp 20   Ht _0  (1.702 m)   Wt 192 lb (87.1 kg)   SpO2 96%   BMI 30.07 kg/m?  ? ?Physical Examination:  ?General: Awake, alert, nontoxic male, No acute distress ?HEENT: Sclera  white.  Moist mucous membranes ?Cardio: regular rate and rhythm, S1S2 heard, no murmurs appreciated ?Pulm: clear to auscultation bilaterally, no wheezes, rhonchi or rales; normal work of breathing on room air ?GI: soft, tenderness not reproducible on exam.  No guarding or rebound.  No palpable masses.  Non-distended. no hepatomegaly, no splenomegaly ?GU: No suprapubic tenderness to palpation.  No CVA tenderness to palpation ? ?Assessment/ Plan: ?36 y.o. male  ? ?Left lower quadrant abdominal pain - Plan: CT Abdomen Pelvis W Contrast, CMP14+EGFR, CBC ? ?Mild intermittent asthma without complication - Plan: albuterol (PROAIR HFA) 108 (90 Base) MCG/ACT inhaler, DISCONTINUED: albuterol (PROAIR HFA) 108 (90 Base) MCG/ACT inhaler ? ?Gastroesophageal reflux disease with esophagitis without hemorrhage - Plan: pantoprazole (PROTONIX) 40 MG tablet ? ?Uncertain etiology of his abdominal pain.  He denies any true GI symptoms.  I favor that perhaps this might be musculoskeletal but he really wanted to have further imaging to rule out other pathologic causes.  He is self-pay so this has been ordered.  I went ahead and got renal function and CBC for completion in preparation of his abdomen CT.  At this time not demonstrating any red flag signs or symptoms. ? ?Asthma has been well controlled.  Albuterol using as needed and I have sent this into the can get 2 of the albuterol's at 1 time ? ?For his GERD PPI has been renewed.  Continue twice daily dosing as needed ? ?No orders of the defined types were placed in this encounter. ? ?No orders of the defined types were placed in this encounter. ? ? ? ?Janora Norlander, DO ?[place] ?[phone] ? ?

## 2021-12-12 NOTE — Patient Instructions (Signed)
You had labs performed today.  You will be contacted with the results of the labs once they are available, usually in the next 3 business days for routine lab work.  If you have an active my chart account, they will be released to your MyChart.  If you prefer to have these labs released to you via telephone, please let us know.     

## 2021-12-13 LAB — CBC
Hematocrit: 47.3 % (ref 37.5–51.0)
Hemoglobin: 16.3 g/dL (ref 13.0–17.7)
MCH: 30.3 pg (ref 26.6–33.0)
MCHC: 34.5 g/dL (ref 31.5–35.7)
MCV: 88 fL (ref 79–97)
Platelets: 273 10*3/uL (ref 150–450)
RBC: 5.38 x10E6/uL (ref 4.14–5.80)
RDW: 12.3 % (ref 11.6–15.4)
WBC: 8.8 10*3/uL (ref 3.4–10.8)

## 2021-12-13 LAB — CMP14+EGFR
ALT: 34 IU/L (ref 0–44)
AST: 24 IU/L (ref 0–40)
Albumin/Globulin Ratio: 1.9 (ref 1.2–2.2)
Albumin: 5 g/dL (ref 4.0–5.0)
Alkaline Phosphatase: 69 IU/L (ref 44–121)
BUN/Creatinine Ratio: 13 (ref 9–20)
BUN: 14 mg/dL (ref 6–20)
Bilirubin Total: 0.5 mg/dL (ref 0.0–1.2)
CO2: 22 mmol/L (ref 20–29)
Calcium: 9.5 mg/dL (ref 8.7–10.2)
Chloride: 102 mmol/L (ref 96–106)
Creatinine, Ser: 1.1 mg/dL (ref 0.76–1.27)
Globulin, Total: 2.7 g/dL (ref 1.5–4.5)
Glucose: 104 mg/dL — ABNORMAL HIGH (ref 70–99)
Potassium: 3.9 mmol/L (ref 3.5–5.2)
Sodium: 139 mmol/L (ref 134–144)
Total Protein: 7.7 g/dL (ref 6.0–8.5)
eGFR: 90 mL/min/{1.73_m2} (ref >59)

## 2024-02-21 ENCOUNTER — Ambulatory Visit (INDEPENDENT_AMBULATORY_CARE_PROVIDER_SITE_OTHER): Payer: Self-pay | Admitting: Family Medicine

## 2024-02-21 ENCOUNTER — Ambulatory Visit: Payer: Self-pay

## 2024-02-21 ENCOUNTER — Encounter: Payer: Self-pay | Admitting: Family Medicine

## 2024-02-21 VITALS — BP 138/92 | HR 90 | Temp 97.0°F | Ht 67.0 in | Wt 189.6 lb

## 2024-02-21 DIAGNOSIS — H66001 Acute suppurative otitis media without spontaneous rupture of ear drum, right ear: Secondary | ICD-10-CM

## 2024-02-21 DIAGNOSIS — B370 Candidal stomatitis: Secondary | ICD-10-CM

## 2024-02-21 DIAGNOSIS — J029 Acute pharyngitis, unspecified: Secondary | ICD-10-CM

## 2024-02-21 DIAGNOSIS — R5381 Other malaise: Secondary | ICD-10-CM

## 2024-02-21 DIAGNOSIS — H9201 Otalgia, right ear: Secondary | ICD-10-CM

## 2024-02-21 DIAGNOSIS — R5383 Other fatigue: Secondary | ICD-10-CM

## 2024-02-21 DIAGNOSIS — J3489 Other specified disorders of nose and nasal sinuses: Secondary | ICD-10-CM

## 2024-02-21 MED ORDER — DOXYCYCLINE HYCLATE 100 MG PO TABS: 100.0000 mg | ORAL_TABLET | Freq: Two times a day (BID) | ORAL | 0 refills | Status: AC

## 2024-02-21 MED ORDER — CLOTRIMAZOLE 10 MG MT TROC: 10.0000 mg | Freq: Every day | OROMUCOSAL | 0 refills | Status: AC

## 2024-02-21 NOTE — Telephone Encounter (Signed)
 E2C2 scheduled appointment.

## 2024-02-21 NOTE — Telephone Encounter (Signed)
  Chief Complaint: sore throat Symptoms: pus on tonsils, pain, ear aches Frequency: about a week Pertinent Negatives: Patient denies fever, difficulty breathing Disposition: [] ED /[] Urgent Care (no appt availability in office) / [x] Appointment(In office/virtual)/ []  [facility] [place]/ [] Home Care/ [] Refused Recommended Disposition /[] [facility]/ []  Follow-up with PCP Additional Notes: Mother calling on behalf of pt states that pt has had sore throat for a week. Pt now has pus on tonsils. Per mother he was seen on Sunday at [facility], tested for numerous things-negative. Scheduled for today DOD. Copied from CRM [id number] [id number]. Topic: Clinical - Red Word Triage >> Feb 21, 2024  7:52 AM Rosamond Comes wrote: Red Word that prompted transfer to Nurse Triage: mom Newton Barer calling, was in Urgent care on Sunday now has white bumps on tongue and throat Reason for Disposition  Earache also present  Answer Assessment - Initial Assessment Questions 1. ONSET: "When did the throat start hurting?" (Hours or days ago)      About a week 2. SEVERITY: "How bad is the sore throat?" (Scale 1-10; mild, moderate or severe)   - MILD (1-3):  Doesn't interfere with eating or normal activities.   - MODERATE (4-7): Interferes with eating some solids and normal activities.   - SEVERE (8-10):  Excruciating pain, interferes with most normal activities.   - SEVERE WITH DYSPHAGIA (10): Can't swallow liquids, drooling.     Caller not sure-moderate 3. STREP EXPOSURE: "Has there been any exposure to strep within the past week?" If Yes, ask: "What type of contact occurred?"      Not to callers knowledge 4.  VIRAL SYMPTOMS: "Are there any symptoms of a cold, such as a runny nose, cough, hoarse voice or red eyes?"      Ear pain,  5. FEVER: "Do you have a fever?" If Yes, ask: "What is your temperature, how was it measured, and when did it start?"     Unsure, doesn't think so 6. PUS ON THE TONSILS: "Is there pus on the tonsils in the  back of your throat?"     yes 7. OTHER SYMPTOMS: "Do you have any other symptoms?" (e.g., difficulty breathing, headache, rash)     denies  Protocols used: Sore Throat-A-AH
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
